# Patient Record
Sex: Female | Born: 1955 | Race: White | Hispanic: No | Smoking: Current every day smoker
Health system: Southern US, Community
[De-identification: ages and names within clinical notes are randomized; demographics above are authoritative.]

## PROBLEM LIST (undated history)

## (undated) DIAGNOSIS — Z720 Tobacco use: Secondary | ICD-10-CM

## (undated) DIAGNOSIS — J441 Chronic obstructive pulmonary disease with (acute) exacerbation: Secondary | ICD-10-CM

## (undated) DIAGNOSIS — J45901 Unspecified asthma with (acute) exacerbation: Secondary | ICD-10-CM

## (undated) DIAGNOSIS — Z8619 Personal history of other infectious and parasitic diseases: Secondary | ICD-10-CM

## (undated) DIAGNOSIS — J189 Pneumonia, unspecified organism: Secondary | ICD-10-CM

## (undated) DIAGNOSIS — E079 Disorder of thyroid, unspecified: Secondary | ICD-10-CM

## (undated) HISTORY — DX: Unspecified asthma with (acute) exacerbation: J45.901

## (undated) HISTORY — DX: Personal history of other infectious and parasitic diseases: Z86.19

## (undated) HISTORY — PX: OTHER SURGICAL HISTORY: SHX169

## (undated) HISTORY — DX: Tobacco use: Z72.0

## (undated) HISTORY — DX: Chronic obstructive pulmonary disease with (acute) exacerbation: J44.1

---

## 1998-01-28 ENCOUNTER — Inpatient Hospital Stay (HOSPITAL_COMMUNITY): Admission: EM | Admit: 1998-01-28 | Discharge: 1998-01-30 | Payer: Self-pay | Admitting: Emergency Medicine

## 1998-01-28 ENCOUNTER — Encounter: Payer: Self-pay | Admitting: Emergency Medicine

## 1998-01-30 ENCOUNTER — Inpatient Hospital Stay (HOSPITAL_COMMUNITY): Admission: EM | Admit: 1998-01-30 | Discharge: 1998-02-01 | Payer: Self-pay | Admitting: *Deleted

## 1998-09-14 ENCOUNTER — Other Ambulatory Visit: Admission: RE | Admit: 1998-09-14 | Discharge: 1998-09-14 | Payer: Self-pay | Admitting: Internal Medicine

## 2004-02-19 ENCOUNTER — Other Ambulatory Visit: Admission: RE | Admit: 2004-02-19 | Discharge: 2004-02-19 | Payer: Self-pay | Admitting: Internal Medicine

## 2004-02-19 ENCOUNTER — Ambulatory Visit: Payer: Self-pay | Admitting: Internal Medicine

## 2004-12-16 ENCOUNTER — Inpatient Hospital Stay (HOSPITAL_COMMUNITY): Admission: EM | Admit: 2004-12-16 | Discharge: 2004-12-19 | Payer: Self-pay | Admitting: Emergency Medicine

## 2004-12-16 ENCOUNTER — Ambulatory Visit: Payer: Self-pay | Admitting: Endocrinology

## 2004-12-23 ENCOUNTER — Ambulatory Visit: Payer: Self-pay | Admitting: Internal Medicine

## 2005-12-25 ENCOUNTER — Ambulatory Visit: Payer: Self-pay | Admitting: Internal Medicine

## 2006-05-25 ENCOUNTER — Ambulatory Visit: Payer: Self-pay | Admitting: Internal Medicine

## 2006-05-25 LAB — CONVERTED CEMR LAB: TSH: 4.98 microintl units/mL (ref 0.35–5.50)

## 2006-11-13 DIAGNOSIS — E039 Hypothyroidism, unspecified: Secondary | ICD-10-CM | POA: Insufficient documentation

## 2006-11-13 DIAGNOSIS — J45909 Unspecified asthma, uncomplicated: Secondary | ICD-10-CM | POA: Insufficient documentation

## 2006-12-17 ENCOUNTER — Ambulatory Visit: Payer: Self-pay | Admitting: Internal Medicine

## 2006-12-17 DIAGNOSIS — T1490XA Injury, unspecified, initial encounter: Secondary | ICD-10-CM

## 2007-02-07 ENCOUNTER — Telehealth: Payer: Self-pay | Admitting: Internal Medicine

## 2007-04-15 ENCOUNTER — Emergency Department (HOSPITAL_COMMUNITY): Admission: EM | Admit: 2007-04-15 | Discharge: 2007-04-15 | Payer: Self-pay | Admitting: Emergency Medicine

## 2007-06-07 ENCOUNTER — Telehealth: Payer: Self-pay | Admitting: Internal Medicine

## 2007-08-18 ENCOUNTER — Emergency Department (HOSPITAL_COMMUNITY): Admission: EM | Admit: 2007-08-18 | Discharge: 2007-08-18 | Payer: Self-pay | Admitting: Emergency Medicine

## 2007-12-04 ENCOUNTER — Telehealth: Payer: Self-pay | Admitting: Internal Medicine

## 2007-12-26 ENCOUNTER — Telehealth (INDEPENDENT_AMBULATORY_CARE_PROVIDER_SITE_OTHER): Payer: Self-pay | Admitting: *Deleted

## 2007-12-27 ENCOUNTER — Ambulatory Visit: Payer: Self-pay | Admitting: Internal Medicine

## 2007-12-27 DIAGNOSIS — F17200 Nicotine dependence, unspecified, uncomplicated: Secondary | ICD-10-CM | POA: Insufficient documentation

## 2008-01-07 ENCOUNTER — Telehealth: Payer: Self-pay | Admitting: Internal Medicine

## 2008-02-15 ENCOUNTER — Emergency Department (HOSPITAL_COMMUNITY): Admission: EM | Admit: 2008-02-15 | Discharge: 2008-02-15 | Payer: Self-pay | Admitting: Emergency Medicine

## 2008-02-15 ENCOUNTER — Encounter: Payer: Self-pay | Admitting: Internal Medicine

## 2008-02-15 ENCOUNTER — Inpatient Hospital Stay (HOSPITAL_COMMUNITY): Admission: EM | Admit: 2008-02-15 | Discharge: 2008-02-17 | Payer: Self-pay | Admitting: Emergency Medicine

## 2008-02-15 ENCOUNTER — Ambulatory Visit: Payer: Self-pay | Admitting: Internal Medicine

## 2008-02-15 DIAGNOSIS — J45901 Unspecified asthma with (acute) exacerbation: Secondary | ICD-10-CM

## 2008-02-15 DIAGNOSIS — J441 Chronic obstructive pulmonary disease with (acute) exacerbation: Secondary | ICD-10-CM

## 2008-02-17 ENCOUNTER — Encounter: Payer: Self-pay | Admitting: Internal Medicine

## 2008-05-04 ENCOUNTER — Ambulatory Visit: Payer: Self-pay | Admitting: Internal Medicine

## 2008-05-04 ENCOUNTER — Inpatient Hospital Stay (HOSPITAL_COMMUNITY): Admission: EM | Admit: 2008-05-04 | Discharge: 2008-05-06 | Payer: Self-pay | Admitting: Emergency Medicine

## 2008-05-05 ENCOUNTER — Encounter: Payer: Self-pay | Admitting: Internal Medicine

## 2008-05-27 ENCOUNTER — Ambulatory Visit: Payer: Self-pay | Admitting: Internal Medicine

## 2008-06-01 ENCOUNTER — Telehealth: Payer: Self-pay | Admitting: Internal Medicine

## 2008-06-02 ENCOUNTER — Ambulatory Visit: Payer: Self-pay | Admitting: *Deleted

## 2008-08-14 ENCOUNTER — Telehealth: Payer: Self-pay | Admitting: *Deleted

## 2008-08-19 ENCOUNTER — Telehealth: Payer: Self-pay | Admitting: Internal Medicine

## 2008-08-21 ENCOUNTER — Telehealth: Payer: Self-pay | Admitting: Internal Medicine

## 2008-10-07 ENCOUNTER — Telehealth: Payer: Self-pay | Admitting: Internal Medicine

## 2009-02-01 ENCOUNTER — Encounter: Payer: Self-pay | Admitting: Family Medicine

## 2009-02-01 ENCOUNTER — Ambulatory Visit: Payer: Self-pay | Admitting: Internal Medicine

## 2009-02-01 LAB — CONVERTED CEMR LAB
ALT: 20 units/L (ref 0–35)
AST: 18 units/L (ref 0–37)
Alkaline Phosphatase: 48 units/L (ref 39–117)
Basophils Absolute: 0.1 10*3/uL (ref 0.0–0.1)
Chlamydia, DNA Probe: NEGATIVE
Cholesterol: 190 mg/dL (ref 0–200)
GC Probe Amp, Genital: NEGATIVE
HCT: 41.4 % (ref 36.0–46.0)
HDL: 43 mg/dL (ref >39)
Hemoglobin: 13.6 g/dL (ref 12.0–15.0)
Monocytes Absolute: 0.7 10*3/uL (ref 0.1–1.0)
Neutro Abs: 5 10*3/uL (ref 1.7–7.7)
RDW: 15.6 % — ABNORMAL HIGH (ref 11.5–15.5)
Total CHOL/HDL Ratio: 4.4
VLDL: 44 mg/dL — ABNORMAL HIGH (ref 0–40)
WBC: 9.7 10*3/uL (ref 4.0–10.5)

## 2009-02-09 ENCOUNTER — Ambulatory Visit (HOSPITAL_COMMUNITY): Admission: RE | Admit: 2009-02-09 | Discharge: 2009-02-09 | Payer: Self-pay | Admitting: Internal Medicine

## 2010-02-01 ENCOUNTER — Emergency Department (HOSPITAL_COMMUNITY): Admission: EM | Admit: 2010-02-01 | Discharge: 2010-02-01 | Payer: Self-pay | Admitting: Emergency Medicine

## 2010-02-18 ENCOUNTER — Emergency Department (HOSPITAL_COMMUNITY)
Admission: EM | Admit: 2010-02-18 | Discharge: 2010-02-18 | Payer: Self-pay | Source: Home / Self Care | Admitting: Emergency Medicine

## 2010-03-09 ENCOUNTER — Telehealth: Payer: Self-pay | Admitting: *Deleted

## 2010-03-25 ENCOUNTER — Emergency Department (HOSPITAL_COMMUNITY)
Admission: EM | Admit: 2010-03-25 | Discharge: 2010-03-25 | Payer: Self-pay | Source: Home / Self Care | Admitting: Emergency Medicine

## 2010-04-21 NOTE — Progress Notes (Signed)
Summary: Pt req work in refill ov todayOut of asthma med-triage  Phone Note Call from Patient Call back at [phone]    Caller: Patient Summary of Call: Pt called and said that she is totally out of her meds for asthma, also almost out of Synthroid. Pt is not with [facility] now and can not get recertified.  Pt is req work in [employer] for Dr. Fabian Sharp today. Pls call asap. Pt says she will end up in ER tonight, if she doesnt get to be seen today.  Initial call taken by: Lucy Antigua,  March 09, 2010 11:45 AM  Follow-up for Phone Call        Wants to know if Dr. Fabian Sharp will take her back until she can get back to [facility] to get prescriptions.  It may be as long as 4 months before she can get re established at [facility]. Follow-up by: Lynann Beaver CMA AAMA,  March 09, 2010 1:08 PM  Additional Follow-up for Phone Call Additional follow up Details #1::        She can reestablish but  see her. need her records sent to [place]  .  I woudnt be able to see her until next week or so.  she may  try urgent care or  if another doc here is available  ( havent seen the message until today .) Additional Follow-up by: Madelin Headings MD,  March 10, 2010 12:47 PM    Additional Follow-up for Phone Call Additional follow up Details #2::    LMTOCB Follow-up by: Romualdo Bolk, CMA Duncan Dull),  March 10, 2010 2:29 PM  Additional Follow-up for Phone Call Additional follow up Details #3:: Details for Additional Follow-up Action Taken: Pt states that she is trying to get [facility] but has been going to ED in the meantime. She can't pay for urgent care up front. Additional Follow-up by: Romualdo Bolk, CMA Duncan Dull),  March 10, 2010 2:39 PM

## 2010-05-19 ENCOUNTER — Emergency Department (HOSPITAL_COMMUNITY)
Admission: EM | Admit: 2010-05-19 | Discharge: 2010-05-19 | Disposition: A | Discharge status: Home / Self Care | Payer: Self-pay | Attending: Emergency Medicine | Admitting: Emergency Medicine

## 2010-05-19 ENCOUNTER — Emergency Department (HOSPITAL_COMMUNITY): Payer: Self-pay

## 2010-05-19 DIAGNOSIS — J3489 Other specified disorders of nose and nasal sinuses: Secondary | ICD-10-CM | POA: Insufficient documentation

## 2010-05-19 DIAGNOSIS — G8929 Other chronic pain: Secondary | ICD-10-CM | POA: Insufficient documentation

## 2010-05-19 DIAGNOSIS — Z79899 Other long term (current) drug therapy: Secondary | ICD-10-CM | POA: Insufficient documentation

## 2010-05-19 DIAGNOSIS — M549 Dorsalgia, unspecified: Secondary | ICD-10-CM | POA: Insufficient documentation

## 2010-05-19 DIAGNOSIS — R0982 Postnasal drip: Secondary | ICD-10-CM | POA: Insufficient documentation

## 2010-05-19 DIAGNOSIS — J45909 Unspecified asthma, uncomplicated: Secondary | ICD-10-CM | POA: Insufficient documentation

## 2010-05-19 DIAGNOSIS — R070 Pain in throat: Secondary | ICD-10-CM | POA: Insufficient documentation

## 2010-05-19 DIAGNOSIS — E039 Hypothyroidism, unspecified: Secondary | ICD-10-CM | POA: Insufficient documentation

## 2010-05-19 DIAGNOSIS — J069 Acute upper respiratory infection, unspecified: Secondary | ICD-10-CM | POA: Insufficient documentation

## 2010-05-19 DIAGNOSIS — R05 Cough: Secondary | ICD-10-CM | POA: Insufficient documentation

## 2010-05-19 DIAGNOSIS — R059 Cough, unspecified: Secondary | ICD-10-CM | POA: Insufficient documentation

## 2010-07-05 LAB — CARDIAC PANEL(CRET KIN+CKTOT+MB+TROPI)
CK, MB: 3.2 ng/mL (ref 0.3–4.0)
Relative Index: INVALID (ref 0.0–2.5)
Total CK: 72 U/L (ref 7–177)

## 2010-07-05 LAB — PROTIME-INR: INR: 1 (ref 0.00–1.49)

## 2010-07-05 LAB — CBC
HCT: 37.3 % (ref 36.0–46.0)
Hemoglobin: 13.2 g/dL (ref 12.0–15.0)
MCHC: 34.9 g/dL (ref 30.0–36.0)
MCHC: 34.5 g/dL (ref 30.0–36.0)
MCV: 104.5 fL — ABNORMAL HIGH (ref 78.0–100.0)
MCV: 104.7 fL — ABNORMAL HIGH (ref 78.0–100.0)
Platelets: 153 10*3/uL (ref 150–400)
RBC: 3.61 MIL/uL — ABNORMAL LOW (ref 3.87–5.11)
RDW: 17.4 % — ABNORMAL HIGH (ref 11.5–15.5)
WBC: 15.6 10*3/uL — ABNORMAL HIGH (ref 4.0–10.5)
WBC: 12.6 10*3/uL — ABNORMAL HIGH (ref 4.0–10.5)

## 2010-07-05 LAB — TROPONIN I: Troponin I: <0.01 ng/mL (ref 0.00–0.06)

## 2010-07-05 LAB — DIFFERENTIAL
Basophils Absolute: 0 10*3/uL (ref 0.0–0.1)
Lymphocytes Relative: 10 % — ABNORMAL LOW (ref 12–46)
Neutrophils Relative %: 88 % — ABNORMAL HIGH (ref 43–77)

## 2010-07-05 LAB — COMPREHENSIVE METABOLIC PANEL
AST: 15 U/L (ref 0–37)
CO2: 25 mEq/L (ref 19–32)
Calcium: 8.8 mg/dL (ref 8.4–10.5)
Chloride: 107 mEq/L (ref 96–112)
GFR calc non Af Amer: >60 mL/min (ref >60)
Glucose, Bld: 144 mg/dL — ABNORMAL HIGH (ref 70–99)
Sodium: 142 mEq/L (ref 135–145)
Total Protein: 6.6 g/dL (ref 6.0–8.3)

## 2010-07-05 LAB — POCT I-STAT 3, ART BLOOD GAS (G3+)
Acid-base deficit: 1 mmol/L (ref 0.0–2.0)
O2 Saturation: 92 %
pCO2 arterial: 34.8 mmHg — ABNORMAL LOW (ref 35.0–45.0)
pH, Arterial: 7.429 — ABNORMAL HIGH (ref 7.350–7.400)

## 2010-07-05 LAB — CK TOTAL AND CKMB (NOT AT ARMC): Total CK: 76 U/L (ref 7–177)

## 2010-07-05 LAB — T3: T3, Total: 73 ng/dl — ABNORMAL LOW (ref 80.0–204.0)

## 2010-08-02 NOTE — H&P (Signed)
NAME:  Phyllis Price, Phyllis Price                    ACCOUNT NO.:  [id number]   MEDICAL RECORD NO.:  [id number]          PATIENT TYPE:  INP   LOCATION:  3737                         FACILITY:  [facility]   PHYSICIAN:  Michiel Cowboy, MDDATE OF BIRTH:  1955-08-01   DATE OF ADMISSION:  05/03/2008  DATE OF DISCHARGE:                              HISTORY & PHYSICAL   CHIEF COMPLAINT:  Shortness of breath and wheezing.   HISTORY OF THE PRESENT ILLNESS:  The patient is a 55 year old female  with a history of asthma, tobacco abuse and hypothyroidism.  The patient  reports that for the past few weeks she has not been feeling well.  She  has been dealing with a cold with runny nose and occasional cough that  she got from her son.  For the past few days though she has noticed  worsening wheezing, which eventually her brought into the emergency  department.  She has been using albuterol at home; however, had stopped  using her Advair about a month ago because she could not afford it.   PAST MEDICAL HISTORY:  The past medical history significant for:  1. Asthma.  2. History of hypothyroidism.  3. Tobacco abuse.   REVIEW OF SYSTEMS:  The review of systems is negative, except for that  noted in the HPI.  She endorses some chest tightness, which she  attributes to her asthma.   SOCIAL HISTORY:  The patient continues to smoke about a pack a day.  Does not use drugs.  She reports drinking alcohol about once a month or  so.   FAMILY HISTORY:  The family history is noncontributory.   ALLERGIES:  No known drug allergies.   MEDICATIONS:  1. Albuterol as needed.  2. Synthroid 75 mcg by mouth daily.  3. The patient stopped her Advair secondary to financial difficulties.   PHYSICAL EXAMINATION:  VITAL SIGNS:  Temperature 98.7, blood pressure  114/77, pulse 107, respirations 24, and she was satting at 88% on room  air and then went up to 99% on 2 liters.  GENERAL APPEARANCE:  The patient appears to be in no  acute distress.  HEENT:  Head is atraumatic.  Somewhat dry mucous membranes.  LUNGS:  The lungs have diffuse wheezes bilaterally.  No crackles.  HEART: The heart has a regular rate and rhythm, but is rapid.  No  murmurs appreciated secondary to severe wheezing.  ABDOMEN:  The abdomen is soft, nontender and nondistended.  EXTREMITIES:  The lower extremities are without clubbing, cyanosis or  edema.  NEUROLOGIC EXAMINATION:  Neurologically she appears to be intact.   LABORATORY DATA:  Labs; none obtained.  Chest x-ray significant for no  infiltrates.  There is hyperinflation noted that could be consistent  with either asthma or COPD.   ASSESSMENT AND PLAN:  1. This is a 55 year old female with a history of asthma now with      asthma exacerbation probably triggered by a recent cold, but we      cannot rule out chronic obstructive pulmonary disease.  We will  admit her for asthma exacerbation.  We will give her Solu-Medrol.      The patient states that she is improving with the nebulizers.  We      will continue with Xopenex given her tachycardia and Atrovent.  If      chronic obstructive pulmonary disease cannot be ruled out we will      also add Augmentin for possible chronic obstructive pulmonary      disease  exacerbation.  I would recommend pulmonary consult in the      morning if there is no significant improvement.  For her baseline      we will obtain arterial blood gases, although the patient is      currently not in respiratory distress and appears to be stable and      comfortable.  2. History of hypothyroidism.  Per review of her records the patient's      thyroid stimulating hormone was down during her last admission.  We      will repeat that and check free T4 and total T3.  For now we will      decrease her dose of Synthroid to 50 and follow the results.  3. Tachycardia.  This is likely related to recent albuterol treatment      as well as shortness of breath, but we  will follow her on      telemetry.  Also given chest tightness this is most likely      noncardiac, but related to her asthma; however, given risk factors      of tobacco abuse and age above 21 we will cycle cardiac enzymes.      We will also obtain a D-dimer although, again __________  would be      less likely in this sensation.  4. Prophylaxes.  Protonix plus Lovenox.   Dr. Rene Paci or her partner will resume the patient's care in  the morning.      Michiel Cowboy, MD  Electronically Signed     AVD/MEDQ  D:  05/04/2008  T:  05/04/2008  Job:  [id number]   cc:   Neta Mends. Fabian Sharp, MD

## 2010-08-02 NOTE — Discharge Summary (Signed)
NAME:  Phyllis Price, Phyllis Price                    ACCOUNT NO.:  [id number]   MEDICAL RECORD NO.:  [id number]          PATIENT TYPE:  INP   LOCATION:  3027                         FACILITY:  [facility]   PHYSICIAN:  Valerie A. Felicity Coyer, MDDATE OF BIRTH:  [birth year]   DATE OF ADMISSION:  05/03/2008  DATE OF DISCHARGE:  05/06/2008                               DISCHARGE SUMMARY   DISCHARGE DIAGNOSES:  1. Acute exacerbation of asthma with underlying bronchitis, improved.      Continue antibiotics with steroid taper and nebs.  2. 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  Cessation      discussed and encouraged.  3. Anxiety disorder.  4. Hypothyroidism.   DISCHARGE MEDICATIONS:  1. Augmentin 875 p.o. b.i.d. x5 additional days to complete 7-day      course.  2. Prednisone 20 mg tablets 3 tablets p.o. daily x3 days, and 2      tablets p.o. daily x3 days, and 1 tablet p.o. daily x3 days, and      1/2 tablet daily x2 days, and stop.  3. DuoNeb 1 treatment q.i.d. x5 days, then q.4 h. p.r.n. shortness of      breath.  4. Ativan 0.5 mg 1 p.o. q.6 h. p.r.n. anxiety, dispense 30 with no      refills.  5. Ventolin inhaler 2 puffs q.4 h. p.r.n. shortness of breath when not      using nebulizer.  6. Synthroid 75 mcg daily.  7. Advair 500/50 b.i.d.   DISPOSITION:  The patient is discharged home in medically improved  condition.  She is hemodynamically stable and her O2 sats were 96% on  room air with exertion on the day of discharge.   Hospital followup is scheduled with her physician, Dr. Alfonse Ras at  [facility], May 27, 2008, at 2:30 p.m.  The patient previously  followed by Dr. Berniece Andreas and is encouraged that she may call as  needed to schedule appointment as she is able.   HOSPITAL COURSE:  1. Acute asthma exacerbation.  The patient is a 55 year old woman with      history of asthma and ongoing tobacco abuse, who came to the      emergency room with increased shortness of breath and  wheezing.      After continuous nebulized treatments and IV steroids in the      emergency room, she was unable to breathe easily, so she was      referred for admission for further evaluation and treatment.  She      was continued on Augmentin for bronchitis like symptoms in the      setting of cold symptoms prior to admission without acute      infiltrate on chest x-ray and nontoxic on exam.  She was treated      with IV Solu-Medrol and q.4-h. nebulizers.  A primary issue was      that of anxiety complicating her breathing, but after 24 hours of      intense medical treatment in this regard, the patient's symptoms  were markedly improved and she was anxious for discharge home.      Followup chest x-ray questioned early patchy infiltrate airspace      disease, but no definitive pneumonia.  She remained afebrile and      nontoxic and was tolerating Augmentin well.  She was transitioned      to oral prednisone, and again cessation of tobacco and other      smoking products was reviewed and encouraged, which the patient      reports she is committed to stopping.  On the third day of      hospitalization, lung exam was much improved and it was reviewed      with her need for continued antibiotic therapy, steroid taper as      described, home nebulizers with resumption of Advair and p.r.n.      albuterol as prior to admission.  She was monitored O2 needs at      home and was 96% on room air after exertion and thus not felt to      require oxygen at this time.  Again, the patient reports she has      done smoking and has followup scheduled at [facility] as listed      above.  She has previously followed with Dr. Fabian Sharp, and is      reminded that if she wishes to follow there, she may call to work      arrangements on this as she is able.  2. Anxiety.  This has been exacerbated by the end of a 3-1/2-year      divorce process, which is now coming to an end.  She was treated      with  p.r.n. Ativan during this hospitalization with good results      and a small prescription for this has been provided as she reports      she has been unable to tolerate other medications for anxiety due      to cough.  Further followup on this issue with her [facility]      physician or Dr. Fabian Sharp on an ongoing outpatient basis.      Valerie A. Felicity Coyer, MD  Electronically Signed     VAL/MEDQ  D:  05/06/2008  T:  05/06/2008  Job:  [id number]

## 2010-08-02 NOTE — Discharge Summary (Signed)
NAME:  Phyllis Price, Phyllis Price                    ACCOUNT NO.:  [id number]   MEDICAL RECORD NO.:  [id number]          PATIENT TYPE:  INP   LOCATION:  5128                         FACILITY:  [facility]   PHYSICIAN:  Valerie A. Felicity Coyer, MDDATE OF BIRTH:  1955/11/25   DATE OF ADMISSION:  02/15/2008  DATE OF DISCHARGE:  02/17/2008                               DISCHARGE SUMMARY   DISCHARGE DIAGNOSES:  1. Acute chronic obstructive pulmonary disease exacerbation in setting      of ongoing tobacco abuse.  2. History of hypothyroidism with suppressed TSH.  Synthroid decreased      this admission, will need outpatient followup TFTs in 4-6 weeks.  3. History of degenerative joint disease.  4. History of syncope.  5. History of mood disorder.  6. History of insomnia.  7. Obesity.  8. History of sinusitis.  9. History of eczema.   HISTORY OF PRESENT ILLNESS:  Ms. Phyllis Price is a 55 year old female who was  admitted on February 15, 2008, with chief complaint of cough and  wheezing which have been present for 3 days.  She denied any fever or  sick contacts.  Her cough has been productive of white sputum.  She  notes that she has smoked off and on for the last several years and is  currently smoking half pack per day.  She apparently was completing an  outpatient prednisone taper at the time of admission.  She was admitted  for further evaluation and treatment.  Of note, she has not been able to  afford her Advair for the last 6 months and her compliance with her  asthma plan has been poor.   COURSE OF HOSPITALIZATION:  1. Acute COPD exacerbation.  The patient was admitted, underwent chest      x-ray which showed stable hyperinflation and central peribronchial      thickening.  She was afebrile with a normal white count.  She was      started on IV Solu-Medrol as well as a nicotine patch and IV      Rocephin as well as IV Zithromax.  She was yesterday changed to      oral prednisone.  Today, she continues to improve  and is anxious      for discharge.  We will plan for discharge to home if her room air      sat is stable with ambulation on a very slow prednisone taper as      well as a 7-day course of oral doxycycline.  2. Hypothyroidism.  The patient was noted to have a depressed TSH      level on her 100 mcg of Synthroid.  TSH this admission was 0.129.      We will decrease her Synthroid to 75 mcg and provide prescription      at the time of discharge; however, she will need to follow up in 4-      6 weeks for TFTs with her primary MD.   MEDICATIONS AT THE TIME OF DISCHARGE:  1. QVAR 80 mcg 2 puffs twice daily in place  of Advair.  2. Proventil MDI 2 puffs every 4 hours as needed.  3. Prednisone 10 mg tablets 4 tablets daily for 3 days, 3 tablets      daily for 3 days, 2 tablets daily for 3 days, and 1 tablet daily      for 3 days.  4. Doxycycline 100 mg p.o. b.i.d. x7 days.  5. Albuterol nebs every 6 hours for the next 1 week, then as needed.  6. Synthroid 75 mcg p.o. daily.   DISPOSITION:  She will be discharged to home.   FOLLOWUP:  She is to follow up with Dr. Berniece Andreas on Friday, February 21, 2008, at 12:15 p.m.  She is instructed to call Dr. Fabian Sharp should she  develop fever over 101, worsening wheezing, or shortness of breath.   Greater than 30 minutes spent on discharge planning.      Sandford Craze, NP      Raenette Rover. Felicity Coyer, MD  Electronically Signed    MO/MEDQ  D:  02/17/2008  T:  02/18/2008  Job:  [id number]   cc:   Neta Mends. Fabian Sharp, MD
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
## 2010-08-05 NOTE — Procedures (Signed)
EEG NUMBER:  [id number].   She is referred for this EEG by her hospitalist, Dr. Rene Paci. The  history given is that of multiple syncopal episodes, question of possible  seizure disorder. This is a routine 16-channel EEG recording with one  channel representing heart rate and rhythm. The patient was exposed to  hyperventilation but not to photic stimulation. The EEG was recorded on  December 16, 2004.   MEDICATIONS:  None listed.   DESCRIPTION:  A posterior dominant background rhythm was not identifiable  for the first half of this recording as the patient is asleep and not awake  and oriented. Once the patient is aroused, her EEG has a posterior dominant  rhythm of 10 Hz that emits synchronously and symmetrically from both  posterior hemispheres, again promptly attenuating with eye opening. Sleep  architecture was synchronous and symmetric throughout. There were vertex  sharp waves as well as sleep spindle formations noticed. No epileptiform  discharges during sleep. The EKG remained in normal sinus rhythm. Once the  patient was aroused and able to participate with her hyperventilation  maneuver, she showed an amplitude buildup and intermittent generalized  slowing which is a normal expected response. There were again no  epileptiform discharges or sharp waves seen emitting from the posterior  hemispheres or central areas of the brain, and the patient fell soon after  her hyperventilation maneuver was completed again back in to sleep.   CONCLUSION:  This is a normal EEG for the patient's age and conscious state.  No epileptiform discharges seen.           ______________________________  Melvyn Novas, M.D.     NW:GNFA  D:  12/17/2004 09:39:07  T:  12/17/2004 10:30:36  Job #:  [id number]

## 2010-09-30 ENCOUNTER — Emergency Department (HOSPITAL_COMMUNITY)
Admission: EM | Admit: 2010-09-30 | Discharge: 2010-09-30 | Disposition: A | Discharge status: Home / Self Care | Payer: Self-pay | Attending: Emergency Medicine | Admitting: Emergency Medicine

## 2010-09-30 DIAGNOSIS — G8929 Other chronic pain: Secondary | ICD-10-CM | POA: Insufficient documentation

## 2010-09-30 DIAGNOSIS — M549 Dorsalgia, unspecified: Secondary | ICD-10-CM | POA: Insufficient documentation

## 2010-09-30 DIAGNOSIS — E039 Hypothyroidism, unspecified: Secondary | ICD-10-CM | POA: Insufficient documentation

## 2010-09-30 DIAGNOSIS — J45909 Unspecified asthma, uncomplicated: Secondary | ICD-10-CM | POA: Insufficient documentation

## 2010-09-30 DIAGNOSIS — R5381 Other malaise: Secondary | ICD-10-CM | POA: Insufficient documentation

## 2010-12-20 ENCOUNTER — Emergency Department (HOSPITAL_COMMUNITY)
Admission: EM | Admit: 2010-12-20 | Discharge: 2010-12-20 | Disposition: A | Discharge status: Home / Self Care | Payer: Self-pay | Attending: Emergency Medicine | Admitting: Emergency Medicine

## 2010-12-20 ENCOUNTER — Emergency Department (HOSPITAL_COMMUNITY): Payer: Self-pay

## 2010-12-20 DIAGNOSIS — Z760 Encounter for issue of repeat prescription: Secondary | ICD-10-CM | POA: Insufficient documentation

## 2010-12-20 DIAGNOSIS — R05 Cough: Secondary | ICD-10-CM | POA: Insufficient documentation

## 2010-12-20 DIAGNOSIS — R059 Cough, unspecified: Secondary | ICD-10-CM | POA: Insufficient documentation

## 2010-12-20 DIAGNOSIS — E039 Hypothyroidism, unspecified: Secondary | ICD-10-CM | POA: Insufficient documentation

## 2010-12-20 DIAGNOSIS — J40 Bronchitis, not specified as acute or chronic: Secondary | ICD-10-CM | POA: Insufficient documentation

## 2010-12-20 DIAGNOSIS — J45909 Unspecified asthma, uncomplicated: Secondary | ICD-10-CM | POA: Insufficient documentation

## 2010-12-20 LAB — CBC
HCT: 39.8 % (ref 36.0–46.0)
Hemoglobin: 13.3 g/dL (ref 12.0–15.0)
MCHC: 33.5 g/dL (ref 30.0–36.0)
Platelets: 184 10*3/uL (ref 150–400)
RDW: 16.3 % — ABNORMAL HIGH (ref 11.5–15.5)

## 2010-12-20 LAB — DIFFERENTIAL
Basophils Absolute: 0 10*3/uL (ref 0.0–0.1)
Basophils Relative: 0 % (ref 0–1)
Eosinophils Relative: 0 % (ref 0–5)
Monocytes Absolute: 0.3 10*3/uL (ref 0.1–1.0)

## 2010-12-20 LAB — TSH: TSH: 0.129 u[IU]/mL — ABNORMAL LOW (ref 0.350–4.500)

## 2010-12-20 LAB — BASIC METABOLIC PANEL
Calcium: 9.4 mg/dL (ref 8.4–10.5)
Chloride: 106 mEq/L (ref 96–112)
Potassium: 4.3 mEq/L (ref 3.5–5.1)
Sodium: 140 mEq/L (ref 135–145)

## 2011-01-27 ENCOUNTER — Emergency Department (INDEPENDENT_AMBULATORY_CARE_PROVIDER_SITE_OTHER)
Admission: EM | Admit: 2011-01-27 | Discharge: 2011-01-27 | Disposition: A | Discharge status: Home / Self Care | Payer: Self-pay | Source: Home / Self Care | Attending: Emergency Medicine | Admitting: Emergency Medicine

## 2011-01-27 ENCOUNTER — Encounter: Payer: Self-pay | Admitting: *Deleted

## 2011-01-27 DIAGNOSIS — E039 Hypothyroidism, unspecified: Secondary | ICD-10-CM

## 2011-01-27 DIAGNOSIS — F17200 Nicotine dependence, unspecified, uncomplicated: Secondary | ICD-10-CM

## 2011-01-27 DIAGNOSIS — J45909 Unspecified asthma, uncomplicated: Secondary | ICD-10-CM

## 2011-01-27 DIAGNOSIS — F17210 Nicotine dependence, cigarettes, uncomplicated: Secondary | ICD-10-CM

## 2011-01-27 HISTORY — DX: Pneumonia, unspecified organism: J18.9

## 2011-01-27 HISTORY — DX: Disorder of thyroid, unspecified: E07.9

## 2011-01-27 MED ORDER — LEVOTHYROXINE SODIUM 100 MCG PO TABS: 100.0000 ug | ORAL_TABLET | Freq: Every day | ORAL | Status: DC

## 2011-01-27 MED ORDER — AZITHROMYCIN 250 MG PO TABS: ORAL_TABLET | ORAL | Status: AC

## 2011-01-27 MED ORDER — PREDNISONE 5 MG PO KIT: 1.0000 | PACK | Freq: Every day | ORAL | Status: DC

## 2011-01-27 MED ORDER — ALBUTEROL SULFATE (2.5 MG/3ML) 0.083% IN NEBU: 2.5000 mg | INHALATION_SOLUTION | RESPIRATORY_TRACT | Status: DC | PRN

## 2011-01-27 NOTE — ED Provider Notes (Signed)
History     CSN: [id number] Arrival date & time: 01/27/2011  6:18 PM   First MD Initiated Contact with Patient 01/27/11 1818      Chief Complaint  Patient presents with  . Asthma    Medication Evaluation    (Consider location/radiation/quality/duration/timing/severity/associated sxs/prior treatment) HPI Comments: Phyllis Price is a 55 year old female who has had asthma since 1999. She usually controls this with albuterol by nebulizer about twice a day. She also has an MDI inhaler as well. She had tried Advair in the past but did not find this particularly effective. Her symptoms are worse in the fall and winter and better in the summer. She was at the emergency room by weeks ago and was diagnosed as having bronchitis. She was given IM steroids and prednisone and she has now run out of her medications, her last treatment being around 10 AM. She's having ongoing symptoms of cough, wheezing, and whitish drainage. She's had some chills but no fever. She denies any nasal congestion, rhinorrhea, or sore throat.  She also has a history of hypothyroidism and is on levothyroxin. She's out of this medication as well and needs a refill. She doesn't have a primary care physician.  She is postmenopausal. She's been hospitalized 7 times in the last 4 years for asthma and she's never been on a ventilator. She still smokes about 5 cigarettes per day.  Patient is a 55 y.o. female presenting with asthma.  Asthma Associated symptoms include shortness of breath. Pertinent negatives include no abdominal pain.    Past Medical History  Diagnosis Date  . Asthma   . Pneumonia   . Thyroid disease     History reviewed. No pertinent past surgical history.  History reviewed. No pertinent family history.  History  Substance Use Topics  . Smoking status: Current Everyday Smoker    Types: Cigarettes  . Smokeless tobacco: Not on file  . Alcohol Use:     OB History    Grav Para Term Preterm Abortions TAB SAB Ect  Mult Living                  Review of Systems  Constitutional: Negative for fever, chills and fatigue.  HENT: Negative for ear pain, congestion, sore throat, rhinorrhea, sneezing, neck stiffness, voice change and postnasal drip.   Eyes: Negative for pain, discharge and redness.  Respiratory: Positive for cough, chest tightness, shortness of breath and wheezing.   Gastrointestinal: Negative for nausea, vomiting, abdominal pain and diarrhea.  Skin: Negative for rash.    Allergies  Review of patient's allergies indicates no known allergies.  Home Medications   Current Outpatient Rx  Name Route Sig Dispense Refill  . ALBUTEROL (5 MG/ML) CONTINUOUS INHALATION SOLN Nebulization Take by nebulization continuous.      Marland Kitchen LEVOTHYROXINE SODIUM PO Oral Take by mouth.      . ALBUTEROL SULFATE (2.5 MG/3ML) 0.083% IN NEBU Nebulization Take 3 mLs (2.5 mg total) by nebulization every 4 (four) hours as needed for wheezing. 120 vial 0  . AZITHROMYCIN 250 MG PO TABS  Take as directed. 6 tablet 0  . LEVOTHYROXINE SODIUM 100 MCG PO TABS Oral Take 1 tablet (100 mcg total) by mouth daily. 30 tablet 0  . PREDNISONE 5 MG PO KIT Oral Take 1 kit (5 mg total) by mouth daily after breakfast. Take as directed. 1 kit 0    BP 121/78  Pulse 75  Temp(Src) 96.8 F (36 C) (Oral)  Resp 18  SpO2 99%  Physical Exam  Nursing note and vitals reviewed. Constitutional: She appears well-developed and well-nourished. No distress.  HENT:  Head: Normocephalic and atraumatic.  Right Ear: External ear normal.  Left Ear: External ear normal.  Nose: Nose normal.  Mouth/Throat: Oropharynx is clear and moist. No oropharyngeal exudate.  Eyes: Conjunctivae and EOM are normal. Pupils are equal, round, and reactive to light. Right eye exhibits no discharge. Left eye exhibits no discharge.  Neck: Normal range of motion. Neck supple.  Cardiovascular: Normal rate, regular rhythm and normal heart sounds.   Pulmonary/Chest: Effort  normal. No stridor. No respiratory distress. She has wheezes (there are diffuse low pitched wheezes bilaterally. She has good air movement.). She has no rales. She exhibits no tenderness.  Lymphadenopathy:    She has no cervical adenopathy.  Skin: Skin is warm and dry. No rash noted. She is not diaphoretic.    ED Course  Procedures (including critical care time)  Labs Reviewed - No data to display No results found.   1. Asthma   2. Hypothyroidism   3. Cigarette smoker       MDM  Her asthma is not very well controlled. She needs to have her primary care physician to follow her. I did give her enough medicine for about a month. I also gave her refill on her of the levothyroxin.  She also get desperately needs to quit smoking and we discussed this. She would like to quit and is thinking about using some over-the-counter methods.        Roque Lias, MD 01/27/11 2223

## 2011-01-27 NOTE — ED Notes (Signed)
C/O running out of albuterol neb solution this AM; c/o some chest tightness & wheezing now.  Also ran out of thyroid med 1 wk ago.  Cannot afford PCP @ this time, and is no longer qualified to continue going to [facility].

## 2011-12-27 ENCOUNTER — Ambulatory Visit (INDEPENDENT_AMBULATORY_CARE_PROVIDER_SITE_OTHER): Payer: Self-pay | Admitting: Internal Medicine

## 2011-12-27 ENCOUNTER — Encounter: Payer: Self-pay | Admitting: Internal Medicine

## 2011-12-27 VITALS — BP 104/74 | HR 110 | Temp 98.4°F | Ht 59.0 in | Wt 139.0 lb

## 2011-12-27 DIAGNOSIS — Z800 Family history of malignant neoplasm of digestive organs: Secondary | ICD-10-CM

## 2011-12-27 DIAGNOSIS — E039 Hypothyroidism, unspecified: Secondary | ICD-10-CM

## 2011-12-27 DIAGNOSIS — Z598 Other problems related to housing and economic circumstances: Secondary | ICD-10-CM

## 2011-12-27 DIAGNOSIS — J45909 Unspecified asthma, uncomplicated: Secondary | ICD-10-CM

## 2011-12-27 DIAGNOSIS — F17200 Nicotine dependence, unspecified, uncomplicated: Secondary | ICD-10-CM

## 2011-12-27 DIAGNOSIS — Z801 Family history of malignant neoplasm of trachea, bronchus and lung: Secondary | ICD-10-CM

## 2011-12-27 MED ORDER — MOMETASONE FURO-FORMOTEROL FUM 100-5 MCG/ACT IN AERO: 2.0000 | INHALATION_SPRAY | Freq: Two times a day (BID) | RESPIRATORY_TRACT | Status: DC

## 2011-12-27 MED ORDER — LEVOTHYROXINE SODIUM 100 MCG PO TABS: 100.0000 ug | ORAL_TABLET | Freq: Every day | ORAL | Status: AC

## 2011-12-27 MED ORDER — PREDNISONE 20 MG PO TABS: ORAL_TABLET | ORAL | Status: DC
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
## 2011-12-27 NOTE — Patient Instructions (Signed)
Become tobacco free for your lungs. Try controller inhaler int he menatiime but if  Not improving in the next 3-5 days can add  Prednisone burst.  Plan labs in 6- 8 weeks or so and then wellness check .  Should get a colonoscopy  Contact us when want [place] to refer.  Get a flu shot when possible

## 2012-01-28 ENCOUNTER — Encounter: Payer: Self-pay | Admitting: Internal Medicine

## 2012-01-28 DIAGNOSIS — Z800 Family history of malignant neoplasm of digestive organs: Secondary | ICD-10-CM | POA: Insufficient documentation

## 2012-01-28 DIAGNOSIS — Z801 Family history of malignant neoplasm of trachea, bronchus and lung: Secondary | ICD-10-CM | POA: Insufficient documentation

## 2012-08-11 ENCOUNTER — Encounter (HOSPITAL_COMMUNITY): Payer: Self-pay | Admitting: Emergency Medicine

## 2012-08-11 ENCOUNTER — Emergency Department (HOSPITAL_COMMUNITY)
Admission: EM | Admit: 2012-08-11 | Discharge: 2012-08-11 | Disposition: A | Discharge status: Home / Self Care | Payer: Self-pay | Attending: Emergency Medicine | Admitting: Emergency Medicine

## 2012-08-11 ENCOUNTER — Other Ambulatory Visit: Payer: Self-pay

## 2012-08-11 ENCOUNTER — Emergency Department (HOSPITAL_COMMUNITY): Payer: Self-pay

## 2012-08-11 DIAGNOSIS — Z8701 Personal history of pneumonia (recurrent): Secondary | ICD-10-CM | POA: Insufficient documentation

## 2012-08-11 DIAGNOSIS — E079 Disorder of thyroid, unspecified: Secondary | ICD-10-CM | POA: Insufficient documentation

## 2012-08-11 DIAGNOSIS — F17200 Nicotine dependence, unspecified, uncomplicated: Secondary | ICD-10-CM | POA: Insufficient documentation

## 2012-08-11 DIAGNOSIS — J441 Chronic obstructive pulmonary disease with (acute) exacerbation: Secondary | ICD-10-CM | POA: Insufficient documentation

## 2012-08-11 DIAGNOSIS — Z720 Tobacco use: Secondary | ICD-10-CM

## 2012-08-11 DIAGNOSIS — Z79899 Other long term (current) drug therapy: Secondary | ICD-10-CM | POA: Insufficient documentation

## 2012-08-11 DIAGNOSIS — R0603 Acute respiratory distress: Secondary | ICD-10-CM

## 2012-08-11 DIAGNOSIS — R509 Fever, unspecified: Secondary | ICD-10-CM | POA: Insufficient documentation

## 2012-08-11 DIAGNOSIS — R059 Cough, unspecified: Secondary | ICD-10-CM | POA: Insufficient documentation

## 2012-08-11 DIAGNOSIS — Z8619 Personal history of other infectious and parasitic diseases: Secondary | ICD-10-CM | POA: Insufficient documentation

## 2012-08-11 DIAGNOSIS — J984 Other disorders of lung: Secondary | ICD-10-CM | POA: Insufficient documentation

## 2012-08-11 DIAGNOSIS — R05 Cough: Secondary | ICD-10-CM | POA: Insufficient documentation

## 2012-08-11 DIAGNOSIS — J440 Chronic obstructive pulmonary disease with acute lower respiratory infection: Secondary | ICD-10-CM

## 2012-08-11 LAB — CBC WITH DIFFERENTIAL/PLATELET
Basophils Absolute: 0 10*3/uL (ref 0.0–0.1)
Basophils Relative: 0 % (ref 0–1)
Eosinophils Absolute: 0.3 10*3/uL (ref 0.0–0.7)
MCH: 34.3 pg — ABNORMAL HIGH (ref 26.0–34.0)
MCHC: 34.5 g/dL (ref 30.0–36.0)
Neutrophils Relative %: 63 % (ref 43–77)
Platelets: 150 10*3/uL (ref 150–400)
RDW: 15.6 % — ABNORMAL HIGH (ref 11.5–15.5)

## 2012-08-11 LAB — COMPREHENSIVE METABOLIC PANEL
ALT: 9 U/L (ref 0–35)
AST: 16 U/L (ref 0–37)
Albumin: 4.3 g/dL (ref 3.5–5.2)
Alkaline Phosphatase: 58 U/L (ref 39–117)
BUN: 8 mg/dL (ref 6–23)
Potassium: 3.8 mEq/L (ref 3.5–5.1)
Sodium: 141 mEq/L (ref 135–145)
Total Protein: 7.6 g/dL (ref 6.0–8.3)

## 2012-08-11 LAB — POCT I-STAT 3, VENOUS BLOOD GAS (G3P V)
O2 Saturation: 32 %
TCO2: 28 mmol/L (ref 0–100)
pCO2, Ven: 46.2 mmHg (ref 45.0–50.0)

## 2012-08-11 MED ORDER — ALBUTEROL SULFATE (5 MG/ML) 0.5% IN NEBU
5.0000 mg | INHALATION_SOLUTION | Freq: Once | RESPIRATORY_TRACT | Status: AC
  Administered 2012-08-11: 5 mg via RESPIRATORY_TRACT
  Filled 2012-08-11: qty 1

## 2012-08-11 MED ORDER — METHYLPREDNISOLONE SODIUM SUCC 125 MG IJ SOLR
125.0000 mg | Freq: Once | INTRAMUSCULAR | Status: AC
  Administered 2012-08-11: 125 mg via INTRAVENOUS
  Filled 2012-08-11: qty 2

## 2012-08-11 MED ORDER — ALBUTEROL SULFATE (5 MG/ML) 0.5% IN NEBU
10.0000 mg | INHALATION_SOLUTION | Freq: Once | RESPIRATORY_TRACT | Status: DC
Start: 2012-08-11 — End: 2012-08-11

## 2012-08-11 MED ORDER — ALBUTEROL (5 MG/ML) CONTINUOUS INHALATION SOLN
10.0000 mg/h | INHALATION_SOLUTION | RESPIRATORY_TRACT | Status: DC
  Administered 2012-08-11: 10 mg/h via RESPIRATORY_TRACT
  Filled 2012-08-11: qty 20

## 2012-08-11 MED ORDER — IPRATROPIUM BROMIDE 0.02 % IN SOLN
0.5000 mg | Freq: Once | RESPIRATORY_TRACT | Status: AC
  Administered 2012-08-11: 0.5 mg via RESPIRATORY_TRACT
  Filled 2012-08-11: qty 2.5

## 2012-08-11 MED ORDER — PREDNISONE 20 MG PO TABS: ORAL_TABLET | ORAL | Status: DC

## 2012-08-11 NOTE — ED Provider Notes (Signed)
History     CSN: [id number]  Arrival date & time 08/11/12  0003   First MD Initiated Contact with Patient 08/11/12 0038      Chief Complaint  Patient presents with  . Asthma    (Consider location/radiation/quality/duration/timing/severity/associated sxs/prior treatment) HPI Patient is a 57 yo F with a history of asthma and 20 pack year, 1ppd smoking hx. Multiple previous hospitalizations for asthma excacerbation and pneumonia. Here with complaints of SOB, wheezing, productive cough and fever. Pt notes some slightly blood tinged sputum.  She has been using her Albuterol nebs at home without adequate relief.   Patient reports mildly diminished po intake. She reports difficulty sleeping due to cough and sob. Sx began 2d ago with nasal congestion and rhinorrhea and have gotten progressively worse.   Endorses chest tightness consistent with multiple previous asthma excacerbations. Denies chest pain.   Initially, patient felt her sx would improve with nebs but, now she is not experiencing any relief despite multiple back to back nebs.   Past Medical History  Diagnosis Date  . Asthma   . Pneumonia   . Thyroid disease   . Chronic obstructive asthma with exacerbation   . Tobacco use   . Hx of varicella     Past Surgical History  Procedure Laterality Date  . Denies      Family History  Problem Relation Age of Onset  . Cancer Father     Lung  . Heart failure Mother     Valve Disease  . Cancer Mother     colon cancer   . Hypertension Mother     History  Substance Use Topics  . Smoking status: Current Every Day Smoker    Types: Cigarettes  . Smokeless tobacco: Not on file  . Alcohol Use: Yes     Comment: ocass use    OB History   Grav Para Term Preterm Abortions TAB SAB Ect Mult Living                  Review of Systems Gen: as per history of present illness, otherwise negative Eyes: no discharge or drainage, no occular pain or visual changes Nose: no epistaxis  or rhinorrhea Mouth: no dental pain, no sore throat Neck: no neck pain Lungs:as per history of present illness, otherwise negative CV: no chest pain, palpitations, dependent edema or orthopnea Abd: no abdominal pain, nausea, vomiting GU: no dysuria or gross hematuria MSK: no myalgias or arthralgias Neuro: no headache, no focal neurologic deficits Skin: no rash Psyche: negative.  Allergies  Review of patient's allergies indicates no known allergies.  Home Medications   Current Outpatient Rx  Name  Route  Sig  Dispense  Refill  . albuterol (PROVENTIL HFA;VENTOLIN HFA) 108 (90 BASE) MCG/ACT inhaler   Inhalation   Inhale 2 puffs into the lungs every 6 (six) hours as needed for wheezing or shortness of breath.         Marland Kitchen albuterol (PROVENTIL, VENTOLIN) (5 MG/ML) 0.5% NEBU   Nebulization   Take by nebulization continuous.           Marland Kitchen levothyroxine (SYNTHROID, LEVOTHROID) 100 MCG tablet   Oral   Take 1 tablet (100 mcg total) by mouth daily.   90 tablet   0   . EXPIRED: albuterol (PROVENTIL) (2.5 MG/3ML) 0.083% nebulizer solution   Nebulization   Take 3 mLs (2.5 mg total) by nebulization every 4 (four) hours as needed for wheezing.   120 vial   0  BP 138/81  Pulse 115  Temp(Src) 97.6 F (36.4 C) (Oral)  Resp 20  SpO2 94%  Physical Exam Gen: well developed and well nourished appearing, ill appearing Head: NCAT Eyes: PERL, EOMI Nose: no epistaixis or rhinorrhea Mouth/throat: mucosa is moist and pink Neck: supple, no stridor Lungs: RR 28/min with very diminished BS and high pitched wheezing bilaterally, + accessory muscle use.  Abd: soft, notender, nondistended Back: no ttp, no cva ttp Skin: no rashese, wnl Ext: normal to inspection, no edema Neuro: CN ii-xii grossly intact, no focal deficits Psyche; normal affect,  calm and cooperative.   ED Course  Procedures (including critical care time)  Results for orders placed during the hospital encounter of  08/11/12 (from the past 24 hour(s))  CBC WITH DIFFERENTIAL     Status: Abnormal   Collection Time    08/11/12  1:05 AM      Result Value Range   WBC 9.8  4.0 - 10.5 K/uL   RBC 4.00  3.87 - 5.11 MIL/uL   Hemoglobin 13.7  12.0 - 15.0 g/dL   HCT 16.1  09.6 - 04.5 %   MCV 99.3  78.0 - 100.0 fL   MCH 34.3 (*) 26.0 - 34.0 pg   MCHC 34.5  30.0 - 36.0 g/dL   RDW 40.9 (*) 81.1 - 91.4 %   Platelets 150  150 - 400 K/uL   Neutrophils Relative % 63  43 - 77 %   Neutro Abs 6.2  1.7 - 7.7 K/uL   Lymphocytes Relative 27  12 - 46 %   Lymphs Abs 2.6  0.7 - 4.0 K/uL   Monocytes Relative 7  3 - 12 %   Monocytes Absolute 0.7  0.1 - 1.0 K/uL   Eosinophils Relative 3  0 - 5 %   Eosinophils Absolute 0.3  0.0 - 0.7 K/uL   Basophils Relative 0  0 - 1 %   Basophils Absolute 0.0  0.0 - 0.1 K/uL  COMPREHENSIVE METABOLIC PANEL     Status: Abnormal   Collection Time    08/11/12  1:05 AM      Result Value Range   Sodium 141  135 - 145 mEq/L   Potassium 3.8  3.5 - 5.1 mEq/L   Chloride 104  96 - 112 mEq/L   CO2 27  19 - 32 mEq/L   Glucose, Bld 124 (*) 70 - 99 mg/dL   BUN 8  6 - 23 mg/dL   Creatinine, Ser 7.82  0.50 - 1.10 mg/dL   Calcium 9.6  8.4 - 95.6 mg/dL   Total Protein 7.6  6.0 - 8.3 g/dL   Albumin 4.3  3.5 - 5.2 g/dL   AST 16  0 - 37 U/L   ALT 9  0 - 35 U/L   Alkaline Phosphatase 58  39 - 117 U/L   Total Bilirubin 0.7  0.3 - 1.2 mg/dL   GFR calc non Af Amer >90  >90 mL/min   GFR calc Af Amer >90  >90 mL/min  POCT I-STAT 3, BLOOD GAS (G3P V)     Status: Abnormal   Collection Time    08/11/12  1:19 AM      Result Value Range   pH, Ven 7.367 (*) 7.250 - 7.300   pCO2, Ven 46.2  45.0 - 50.0 mmHg   pO2, Ven 21.0 (*) 30.0 - 45.0 mmHg   Bicarbonate 26.6 (*) 20.0 - 24.0 mEq/L   TCO2 28  0 - 100  mmol/L   O2 Saturation 32.0     Acid-Base Excess 1.0  0.0 - 2.0 mmol/L   Sample type VENOUS     Comment NOTIFIED PHYSICIAN     EKG: nsr, no acute ischemic changes, normal intervals, normal axis, normal  qrs complex  CXR: normal cardiac silloute, normal appearing mediastinum, no infiltrates, no acute process identified.    MDM  DDX: asthma/copd excacerbation, acute bronchitis, pneumonia, pneumothroax, pleural effusion.   Patient has been treated with albuterol 10mg /Atrovent 0.5mg  and another Albuterol 5mg  svn along with prednisone for asthma/copd exacacerbation. Feeling better but, remains persistently tachycardic. Wheezing persists although patient is moving air much better. Sats marginal at 93 to 94% on RA while at rest. CXR negative for acute process. Normal VBG.   Patient says she is feeling much better and is adamant about wish to be discharged. We will check ambulatory 02 sats and plan for discharge. I have counseled the patient regarding absolute importance of smoking cessation, need for steroid burst and outpatient f/u. Patient agrees to return to the ED or call 911 promptly for any decompensation or worsening sx.         Brandt Loosen, MD 08/11/12 [phone]

## 2012-08-11 NOTE — ED Notes (Signed)
PT. REPORTS PROGRESSING ASTHMA WITH PRODUCTIVE COUGH / NASAL CONGESTION FOR 4 DAYS UNRELIEVED BY MDI, DENIES FEVER OR CHILLS.

## 2012-08-11 NOTE — ED Notes (Signed)
Ambulated patient in the hall without any shortness of breath, patient did have a coughing spell after she returned to the room

## 2012-08-11 NOTE — ED Notes (Signed)
Xray and Phleb in PT room

## 2012-08-17 LAB — CULTURE, BLOOD (ROUTINE X 2): Culture: NO GROWTH

## 2013-05-19 ENCOUNTER — Emergency Department (INDEPENDENT_AMBULATORY_CARE_PROVIDER_SITE_OTHER): Payer: Self-pay

## 2013-05-19 ENCOUNTER — Encounter (HOSPITAL_COMMUNITY): Payer: Self-pay | Admitting: Emergency Medicine

## 2013-05-19 ENCOUNTER — Emergency Department (INDEPENDENT_AMBULATORY_CARE_PROVIDER_SITE_OTHER)
Admission: EM | Admit: 2013-05-19 | Discharge: 2013-05-19 | Disposition: A | Discharge status: Home / Self Care | Payer: Self-pay | Source: Home / Self Care | Attending: Emergency Medicine | Admitting: Emergency Medicine

## 2013-05-19 ENCOUNTER — Telehealth: Payer: Self-pay | Admitting: Internal Medicine

## 2013-05-19 DIAGNOSIS — J111 Influenza due to unidentified influenza virus with other respiratory manifestations: Secondary | ICD-10-CM

## 2013-05-19 DIAGNOSIS — J209 Acute bronchitis, unspecified: Secondary | ICD-10-CM

## 2013-05-19 DIAGNOSIS — J440 Chronic obstructive pulmonary disease with acute lower respiratory infection: Secondary | ICD-10-CM

## 2013-05-19 DIAGNOSIS — R69 Illness, unspecified: Principal | ICD-10-CM

## 2013-05-19 MED ORDER — GUAIFENESIN-CODEINE 100-10 MG/5ML PO SYRP: 10.0000 mL | ORAL_SOLUTION | Freq: Four times a day (QID) | ORAL | Status: DC | PRN

## 2013-05-19 MED ORDER — IPRATROPIUM-ALBUTEROL 0.5-2.5 (3) MG/3ML IN SOLN
RESPIRATORY_TRACT | Status: AC
  Filled 2013-05-19: qty 6

## 2013-05-19 MED ORDER — DOXYCYCLINE HYCLATE 100 MG PO TABS: 100.0000 mg | ORAL_TABLET | Freq: Two times a day (BID) | ORAL | Status: DC

## 2013-05-19 MED ORDER — PREDNISONE 20 MG PO TABS: 20.0000 mg | ORAL_TABLET | Freq: Two times a day (BID) | ORAL | Status: DC

## 2013-05-19 MED ORDER — METHYLPREDNISOLONE ACETATE 80 MG/ML IJ SUSP
INTRAMUSCULAR | Status: AC
  Filled 2013-05-19: qty 1

## 2013-05-19 MED ORDER — ALBUTEROL SULFATE (2.5 MG/3ML) 0.083% IN NEBU: 2.5000 mg | INHALATION_SOLUTION | Freq: Four times a day (QID) | RESPIRATORY_TRACT | Status: DC | PRN

## 2013-05-19 MED ORDER — IPRATROPIUM-ALBUTEROL 0.5-2.5 (3) MG/3ML IN SOLN
3.0000 mL | RESPIRATORY_TRACT | Status: DC
  Administered 2013-05-19: 3 mL via RESPIRATORY_TRACT

## 2013-05-19 MED ORDER — ALBUTEROL SULFATE HFA 108 (90 BASE) MCG/ACT IN AERS: 1.0000 | INHALATION_SPRAY | Freq: Four times a day (QID) | RESPIRATORY_TRACT | Status: DC | PRN

## 2013-05-19 MED ORDER — METHYLPREDNISOLONE ACETATE 80 MG/ML IJ SUSP
80.0000 mg | Freq: Once | INTRAMUSCULAR | Status: AC
  Administered 2013-05-19: 80 mg via INTRAMUSCULAR

## 2013-05-19 NOTE — ED Notes (Signed)
reports flu like symptoms off/on for the past five weeks.    C/o  Chills.  Fever.  Body aches.  Sob, hx of asthma and COPD

## 2013-05-19 NOTE — Telephone Encounter (Signed)
Noted  

## 2013-05-19 NOTE — Telephone Encounter (Signed)
Patient Information:  Caller Name: Phyllis Price  Phone: [phone]  Patient: Phyllis Price, Phyllis Price  Gender: Female  DOB: 1955/07/20  Age: 58 Years  PCP: Berniece AndreasPanosh, Wanda (Family Practice)  Office Follow Up:  Does the office need to follow up with this patient?: No  Instructions For The Office: N/A  RN Note:  [facility] - says if not available to assign appointment right [facility], does not want to wait any time to get Appointment.  Symptoms  Reason For Call & Symptoms: Started with bad cold 5 weeks ago, improved some after 2 weeks but symptoms becoming more severe one week later.  Kept thinking it would get better but symptoms not improving at all.  Has lot of congestion, lot of cough.  Coughing up very thick green mucus with streak fo blood in mornings, sputum cleared in the evenings  Face hurts, eyes sensitive to light and sound.  At one time not eating for a week then finally had BM that was runny.  Has lost 30 Lbs in the last 5 weeks,  Shaking chills sometimes as long as 30 minutes and fever (per touch) daily.  Really feeling terrible.  Reviewed Health History In EMR: Yes  Reviewed Medications In EMR: Yes  Reviewed Allergies In EMR: Yes  Reviewed Surgeries / Procedures: Yes  Date of Onset of Symptoms: Unknown  Treatments Tried: Mucinex  Treatments Tried Worked: No  Guideline(s) Used:  Cough  Disposition Per Guideline:   Go to Office Now  Reason For Disposition Reached:   Coughed up > 1 tablespoon (15 ml) blood (Exception: blood-tinged sputum)  Advice Given:  N/A  Patient Refused Recommendation:  [facility] Will [facility] To [facility]  [facility], [facility].  S[facility] and hung up.

## 2013-05-19 NOTE — Discharge Instructions (Signed)
Acute Bronchitis Bronchitis is inflammation of the airways that extend from the windpipe into the lungs (bronchi). The inflammation often causes mucus to develop. This leads to a cough, which is the most common symptom of bronchitis.  In acute bronchitis, the condition usually develops suddenly and goes away over time, usually in a couple weeks. Smoking, allergies, and asthma can make bronchitis worse. Repeated episodes of bronchitis may cause further lung problems.  CAUSES Acute bronchitis is most often caused by the same virus that causes a cold. The virus can spread from person to person (contagious).  SIGNS AND SYMPTOMS   Cough.   Fever.   Coughing up mucus.   Body aches.   Chest congestion.   Chills.   Shortness of breath.   Sore throat.  DIAGNOSIS  Acute bronchitis is usually diagnosed through a physical exam. Tests, such as chest X-rays, are sometimes done to rule out other conditions.  TREATMENT  Acute bronchitis usually goes away in a couple weeks. Often times, no medical treatment is necessary. Medicines are sometimes given for relief of fever or cough. Antibiotics are usually not needed but may be prescribed in certain situations. In some cases, an inhaler may be recommended to help reduce shortness of breath and control the cough. A cool mist vaporizer may also be used to help thin bronchial secretions and make it easier to clear the chest.  HOME CARE INSTRUCTIONS  Get plenty of rest.   Drink enough fluids to keep your urine clear or pale yellow (unless you have a medical condition that requires fluid restriction). Increasing fluids may help thin your secretions and will prevent dehydration.   Only take over-the-counter or prescription medicines as directed by your health care provider.   Avoid smoking and secondhand smoke. Exposure to cigarette smoke or irritating chemicals will make bronchitis worse. If you are a smoker, consider using nicotine gum or skin  patches to help control withdrawal symptoms. Quitting smoking will help your lungs heal faster.   Reduce the chances of another bout of acute bronchitis by washing your hands frequently, avoiding people with cold symptoms, and trying not to touch your hands to your mouth, nose, or eyes.   Follow up with your health care provider as directed.  SEEK MEDICAL CARE IF: Your symptoms do not improve after 1 week of treatment.  SEEK IMMEDIATE MEDICAL CARE IF:  You develop an increased fever or chills.   You have chest pain.   You have severe shortness of breath.  You have bloody sputum.   You develop dehydration.  You develop fainting.  You develop repeated vomiting.  You develop a severe headache. MAKE SURE YOU:   Understand these instructions.  Will watch your condition.  Will get help right away if you are not doing well or get worse. Document Released: 04/13/2004 Document Revised: 11/06/2012 Document Reviewed: 08/27/2012 [facility] Patient Information 2014 [place], [place].  Chronic Obstructive Pulmonary Disease Chronic obstructive pulmonary disease (COPD) is a common lung condition in which airflow from the lungs is limited. COPD is a general term that can be used to describe many different lung problems that limit airflow, including both chronic bronchitis and emphysema. If you have COPD, your lung function will probably never return to normal, but there are measures you can take to improve lung function and make yourself feel better.  CAUSES   Smoking (common).   Exposure to secondhand smoke.   Genetic problems.  Chronic inflammatory lung diseases or recurrent infections. SYMPTOMS   Shortness  of breath, especially with physical activity.   Deep, persistent (chronic) cough with a large amount of thick mucus.   Wheezing.   Rapid breaths (tachypnea).   Gray or bluish discoloration (cyanosis) of the skin, especially in fingers, toes, or lips.   Fatigue.    Weight loss.   Frequent infections or episodes when breathing symptoms become much worse (exacerbations).   Chest tightness. DIAGNOSIS  Your healthcare provider will take a medical history and perform a physical examination to make the initial diagnosis. Additional tests for COPD may include:   Lung (pulmonary) function tests.  Chest X-ray.  CT scan.  Blood tests. TREATMENT  Treatment available to help you feel better when you have COPD include:   Inhaler and nebulizer medicines. These help manage the symptoms of COPD and make your breathing more comfortable  Supplemental oxygen. Supplemental oxygen is only helpful if you have a low oxygen level in your blood.   Exercise and physical activity. These are beneficial for nearly all people with COPD. Some people may also benefit from a pulmonary rehabilitation program. HOME CARE INSTRUCTIONS   Take all medicines (inhaled or pills) as directed by your health care provider.  Only take over-the-counter or prescription medicines for pain, fever, or discomfort as directed by your health care provider.   Avoid over-the-counter medicines or cough syrups that dry up your airway (such as antihistamines) and slow down the elimination of secretions unless instructed otherwise by your healthcare provider.   If you are a smoker, the most important thing that you can do is stop smoking. Continuing to smoke will cause further lung damage and breathing trouble. Ask your health care provider for help with quitting smoking. He or she can direct you to community resources or hospitals that provide support.  Avoid exposure to irritants such as smoke, chemicals, and fumes that aggravate your breathing.  Use oxygen therapy and pulmonary rehabilitation if directed by your health care provider. If you require home oxygen therapy, ask your healthcare provider whether you should purchase a pulse oximeter to measure your oxygen level at home.    Avoid contact with individuals who have a contagious illness.  Avoid extreme temperature and humidity changes.  Eat healthy foods. Eating smaller, more frequent meals and resting before meals may help you maintain your strength.  Stay active, but balance activity with periods of rest. Exercise and physical activity will help you maintain your ability to do things you want to do.  Preventing infection and hospitalization is very important when you have COPD. Make sure to receive all the vaccines your health care provider recommends, especially the pneumococcal and influenza vaccines. Ask your healthcare provider whether you need a pneumonia vaccine.  Learn and use relaxation techniques to manage stress.  Learn and use controlled breathing techniques as directed by your health care provider. Controlled breathing techniques include:   Pursed lip breathing. Start by breathing in (inhaling) through your nose for 1 second. Then, purse your lips as if you were going to whistle and breathe out (exhale) through the pursed lips for 2 seconds.   Diaphragmatic breathing. Start by putting one hand on your abdomen just above your waist. Inhale slowly through your nose. The hand on your abdomen should move out. Then purse your lips and exhale slowly. You should be able to feel the hand on your abdomen moving in as you exhale.   Learn and use controlled coughing to clear mucus from your lungs. Controlled coughing is a series of short,  progressive coughs. The steps of controlled coughing are:  1. Lean your head slightly forward.  2. Breathe in deeply using diaphragmatic breathing.  3. Try to hold your breath for 3 seconds.  4. Keep your mouth slightly open while coughing twice.  5. Spit any mucus out into a tissue.  6. Rest and repeat the steps once or twice as needed. SEEK MEDICAL CARE IF:   You are coughing up more mucus than usual.   There is a change in the color or thickness of your  mucus.   Your breathing is more labored than usual.   Your breathing is faster than usual.  SEEK IMMEDIATE MEDICAL CARE IF:   You have shortness of breath while you are resting.   You have shortness of breath that prevents you from:  Being able to talk.   Performing your usual physical activities.   You have chest pain lasting longer than 5 minutes.   Your skin color is more cyanotic than usual.  You measure low oxygen saturations for longer than 5 minutes with a pulse oximeter. MAKE SURE YOU:   Understand these instructions.  Will watch your condition.  Will get help right away if you are not doing well or get worse. Document Released: 12/14/2004 Document Revised: 12/25/2012 Document Reviewed: 10/31/2012 [facility] Patient Information 2014 [place], [place].  Influenza, Adult Influenza ("the flu") is a viral infection of the respiratory tract. It occurs more often in winter months because people spend more time in close contact with one another. Influenza can make you feel very sick. Influenza easily spreads from person to person (contagious). CAUSES  Influenza is caused by a virus that infects the respiratory tract. You can catch the virus by breathing in droplets from an infected person's cough or sneeze. You can also catch the virus by touching something that was recently contaminated with the virus and then touching your mouth, nose, or eyes. SYMPTOMS  Symptoms typically last 4 to 10 days and may include:  Fever.  Chills.  Headache, body aches, and muscle aches.  Sore throat.  Chest discomfort and cough.  Poor appetite.  Weakness or feeling tired.  Dizziness.  Nausea or vomiting. DIAGNOSIS  Diagnosis of influenza is often made based on your history and a physical exam. A nose or throat swab test can be done to confirm the diagnosis. RISKS AND COMPLICATIONS You may be at risk for a more severe case of influenza if you smoke cigarettes, have diabetes,  have chronic heart disease (such as heart failure) or lung disease (such as asthma), or if you have a weakened immune system. Elderly people and pregnant women are also at risk for more serious infections. The most common complication of influenza is a lung infection (pneumonia). Sometimes, this complication can require emergency medical care and may be life-threatening. PREVENTION  An annual influenza vaccination (flu shot) is the best way to avoid getting influenza. An annual flu shot is now routinely recommended for all adults in the [place] TREATMENT  In mild cases, influenza goes away on its own. Treatment is directed at relieving symptoms. For more severe cases, your caregiver may prescribe antiviral medicines to shorten the sickness. Antibiotic medicines are not effective, because the infection is caused by a virus, not by bacteria. HOME CARE INSTRUCTIONS  Only take over-the-counter or prescription medicines for pain, discomfort, or fever as directed by your caregiver.  Use a cool mist humidifier to make breathing easier.  Get plenty of rest until your temperature returns to  normal. This usually takes 3 to 4 days.  Drink enough fluids to keep your urine clear or pale yellow.  Cover your mouth and nose when coughing or sneezing, and wash your hands well to avoid spreading the virus.  Stay home from work or school until your fever has been gone for at least 1 full day. SEEK MEDICAL CARE IF:   You have chest pain or a deep cough that worsens or produces more mucus.  You have nausea, vomiting, or diarrhea. SEEK IMMEDIATE MEDICAL CARE IF:   You have difficulty breathing, shortness of breath, or your skin or nails turn bluish.  You have severe neck pain or stiffness.  You have a severe headache, facial pain, or earache.  You have a worsening or recurring fever.  You have nausea or vomiting that cannot be controlled. MAKE SURE YOU:  Understand these instructions.  Will watch your  condition.  Will get help right away if you are not doing well or get worse. Document Released: 03/03/2000 Document Revised: 09/05/2011 Document Reviewed: 06/05/2011 [facility] Patient Information 2014 [place], [place].

## 2013-05-19 NOTE — ED Provider Notes (Signed)
Chief Complaint   Chief Complaint  Patient presents with  . Influenza    History of Present Illness   Phyllis Price is a 58 year old female cigarette smoker who has had a two-week history of generalized myalgias, headache, light and sound sensitivity, subjective fever, sweats, chills, cough productive yellow sputum with blood, chest tightness, wheezing, nasal congestion with yellow rhinorrhea, headache, sinus pressure, buzzing in the ears, and sore throat. She has a history of asthma and COPD. She sees Dr. Fabian SharpPanosh for this. She does not have a pulmonologist. Her only medication is albuterol both by MDI inhaler and by nebulizer. She occasionally smokes cigarettes.  Review of Systems   Other than as noted above, the patient denies any of the following symptoms: Systemic:  No fevers, chills, sweats, or myalgias. Eye:  No redness or discharge. ENT:  No ear pain, headache, nasal congestion, drainage, sinus pressure, or sore throat. Neck:  No neck pain, stiffness, or swollen glands. Lungs:  No cough, sputum production, hemoptysis, wheezing, chest tightness, shortness of breath or chest pain. GI:  No abdominal pain, nausea, vomiting or diarrhea.  PMFSH   Past medical history, family history, social history, meds, and allergies were reviewed. She has hypothyroidism and takes Synthroid.  Physical exam   Vital signs:  BP 111/72  Pulse 80  Temp(Src) 97.7 F (36.5 C) (Oral)  Resp 18  SpO2 99% General:  Alert and oriented.  In no distress.  Skin warm and dry. Eye:  No conjunctival injection or drainage. Lids were normal. ENT:  TMs and canals were normal, without erythema or inflammation.  Nasal mucosa was clear and uncongested, without drainage.  Mucous membranes were moist.  Pharynx was clear with no exudate or drainage.  There were no oral ulcerations or lesions. Neck:  Supple, no adenopathy, tenderness or mass. Lungs:  No respiratory distress.  She has good air movement bilaterally.  There are scattered expiratory wheezes, no rales or rhonchi.  Heart:  Regular rhythm, without gallops, murmers or rubs. Skin:  Clear, warm, and dry, without rash or lesions.   Radiology   Dg Chest 2 View  05/19/2013   CLINICAL DATA:  Cough.  EXAM: CHEST  2 VIEW  COMPARISON:  Aug 11, 2012.  FINDINGS: The heart size and mediastinal contours are within normal limits. Both lungs are clear. No pneumothorax or pleural effusion is noted. The visualized skeletal structures are unremarkable.  IMPRESSION: No active cardiopulmonary disease.   Electronically Signed   By: Roque LiasJames  Green M.D.   On: 05/19/2013 15:38   [facility]   She was given a DuoNeb breathing treatment with complete clearing her lungs afterwards. She was also given Depo-Medrol 80 mg IM.  Assessment     The primary encounter diagnosis was Influenza-like illness. A diagnosis of COPD with acute bronchitis was also pertinent to this visit.  I think she needs to be followed by pulmonologist as well.  Plan    1.  Meds:  The following meds were prescribed:   New Prescriptions   ALBUTEROL (PROVENTIL HFA;VENTOLIN HFA) 108 (90 BASE) MCG/ACT INHALER    Inhale 1-2 puffs into the lungs every 6 (six) hours as needed for wheezing or shortness of breath.   ALBUTEROL (PROVENTIL) (2.5 MG/3ML) 0.083% NEBULIZER SOLUTION    Take 3 mLs (2.5 mg total) by nebulization every 6 (six) hours as needed for wheezing.   DOXYCYCLINE (VIBRA-TABS) 100 MG TABLET    Take 1 tablet (100 mg total) by mouth 2 (two) times  daily.   GUAIFENESIN-CODEINE (GUIATUSS AC) 100-10 MG/5ML SYRUP    Take 10 mLs by mouth 4 (four) times daily as needed for cough.   PREDNISONE (DELTASONE) 20 MG TABLET    Take 1 tablet (20 mg total) by mouth 2 (two) times daily.    2.  Patient Education/Counseling:  The patient was given appropriate handouts, self care instructions, and instructed in symptomatic relief.  Instructed to get extra fluids, rest, and use a cool mist vaporizer.     3.  Follow up:  The patient was told to follow up here if no better in 3 to 4 days, or sooner if becoming worse in any way, and given some red flag symptoms such as increasing fever, difficulty breathing, chest pain, or persistent vomiting which would prompt immediate return.  Follow up here or with her primary care physician as needed. I also suggested she see Dr. Levy Pupa for pulmonology consult for COPD.      Reuben Likes, MD 05/19/13 [phone]

## 2015-07-09 IMAGING — CR DG CHEST 2V
2 series · 2 of 2 positions shown · non-contrast
Comparison: August 11, 2012.

CLINICAL DATA: Cough.

EXAM:
CHEST  2 VIEW

[view not recorded (1 of 2)]
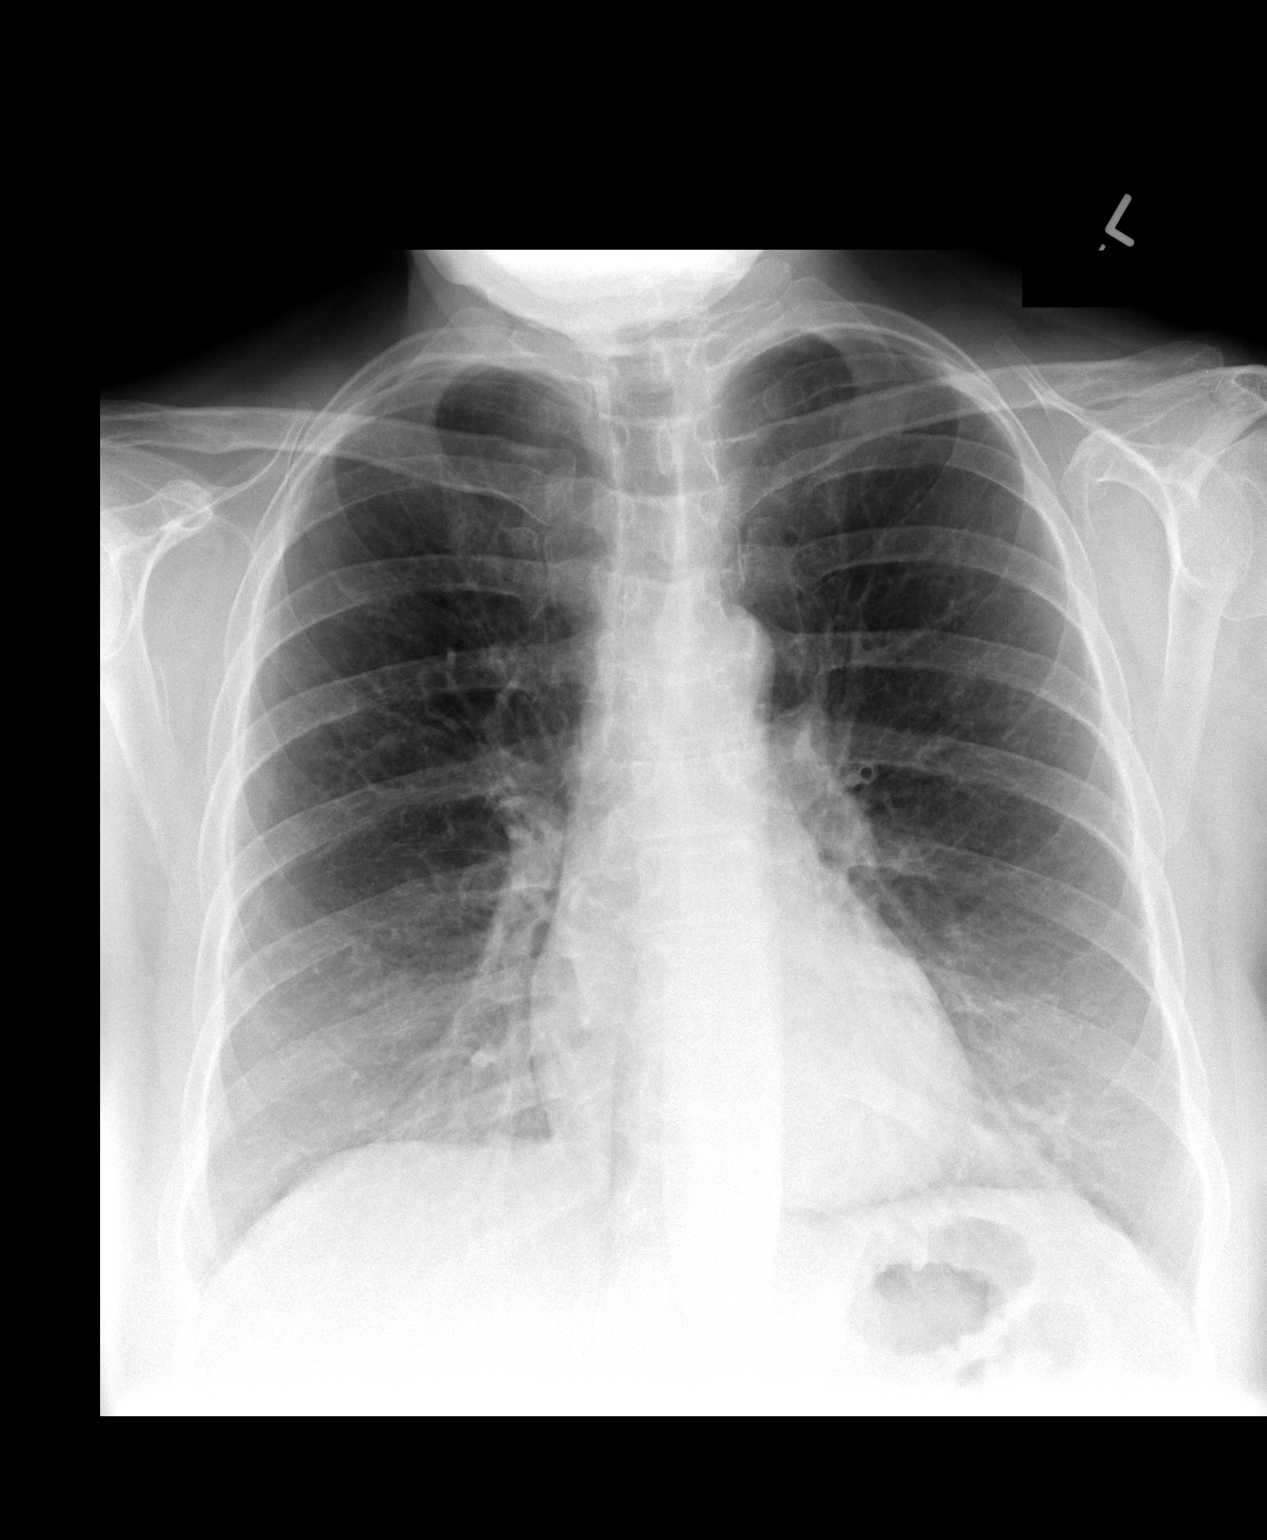

[view not recorded (2 of 2)]
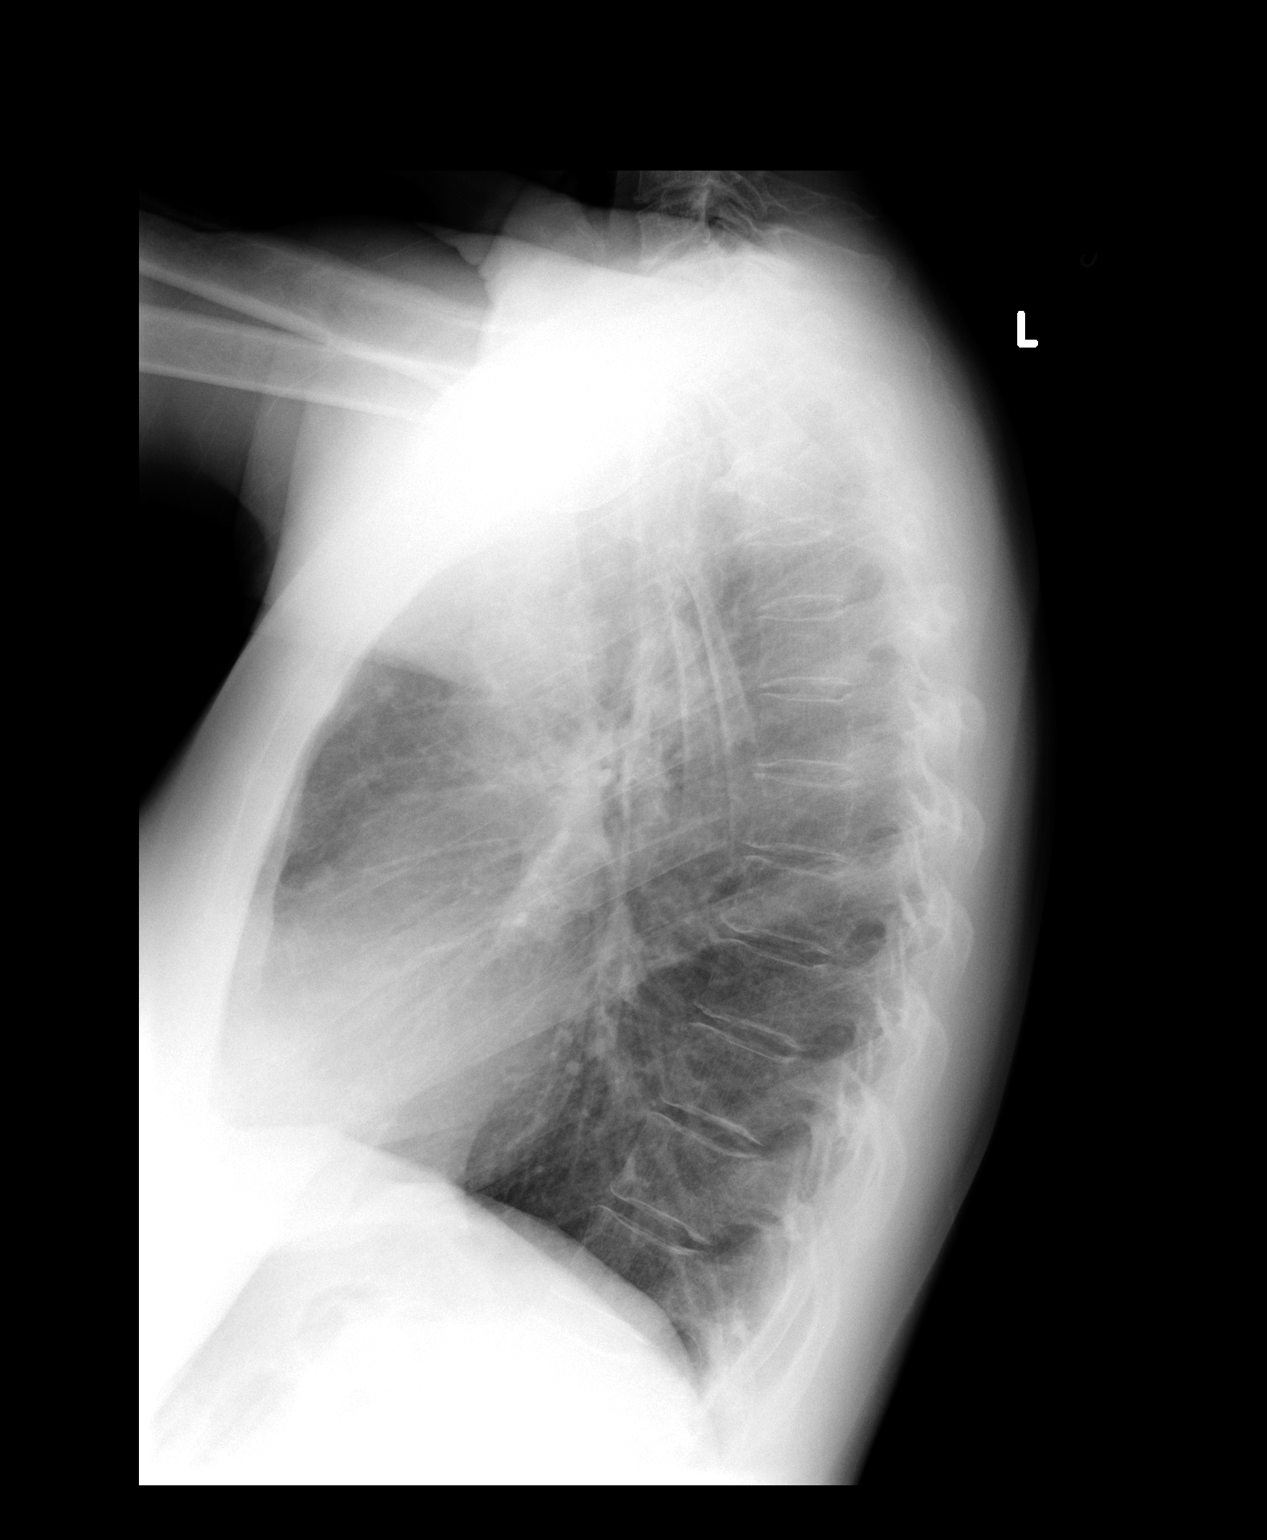

[2 of 2 positions shown; findings below may reference images not displayed]

FINDINGS: The heart size and mediastinal contours are within normal limits.
Both lungs are clear. No pneumothorax or pleural effusion is noted.
The visualized skeletal structures are unremarkable.
IMPRESSION: No active cardiopulmonary disease.

## 2020-10-13 ENCOUNTER — Telehealth: Payer: Self-pay

## 2020-10-13 NOTE — Telephone Encounter (Signed)
Called the patient and the Voice mail is not activated to leave a message

## 2020-10-13 NOTE — Telephone Encounter (Signed)
Cannot take on new patients. Please direct her to providers taking new patients . Wishing her well.

## 2020-10-13 NOTE — Telephone Encounter (Signed)
This patient is wanting to re-establish care with you.   She last seen you on 12/27/2011  Can I schedule her to re-establish care with you?

## 2020-10-20 ENCOUNTER — Telehealth: Payer: Self-pay

## 2020-10-20 NOTE — Telephone Encounter (Signed)
Per previous telephone note Dr. Fabian Sharp is not accepting new patients.

## 2020-10-20 NOTE — Telephone Encounter (Signed)
Patient called wanting to know if Dr. Fabian Sharp will accept her back as a patient call back #[phone]

## 2020-12-06 DIAGNOSIS — J4531 Mild persistent asthma with (acute) exacerbation: Secondary | ICD-10-CM | POA: Diagnosis not present

## 2021-03-03 ENCOUNTER — Ambulatory Visit: Payer: Self-pay | Admitting: Registered Nurse

## 2021-03-05 DIAGNOSIS — R062 Wheezing: Secondary | ICD-10-CM | POA: Diagnosis not present

## 2021-03-29 ENCOUNTER — Encounter (HOSPITAL_COMMUNITY): Payer: Self-pay

## 2021-03-29 ENCOUNTER — Ambulatory Visit (HOSPITAL_COMMUNITY)
Admission: RE | Admit: 2021-03-29 | Discharge: 2021-03-29 | Disposition: A | Discharge status: Home / Self Care | Payer: Medicare HMO | Source: Ambulatory Visit | Attending: Family Medicine | Admitting: Family Medicine

## 2021-03-29 ENCOUNTER — Other Ambulatory Visit: Payer: Self-pay

## 2021-03-29 ENCOUNTER — Ambulatory Visit (INDEPENDENT_AMBULATORY_CARE_PROVIDER_SITE_OTHER): Payer: Medicare HMO

## 2021-03-29 VITALS — BP 147/119 | HR 91 | Temp 97.6°F | Resp 16

## 2021-03-29 DIAGNOSIS — R0602 Shortness of breath: Secondary | ICD-10-CM

## 2021-03-29 DIAGNOSIS — J4521 Mild intermittent asthma with (acute) exacerbation: Secondary | ICD-10-CM | POA: Diagnosis not present

## 2021-03-29 DIAGNOSIS — R051 Acute cough: Secondary | ICD-10-CM

## 2021-03-29 DIAGNOSIS — R059 Cough, unspecified: Secondary | ICD-10-CM | POA: Diagnosis not present

## 2021-03-29 DIAGNOSIS — J441 Chronic obstructive pulmonary disease with (acute) exacerbation: Secondary | ICD-10-CM | POA: Diagnosis not present

## 2021-03-29 MED ORDER — ALBUTEROL SULFATE (2.5 MG/3ML) 0.083% IN NEBU: 2.5000 mg | INHALATION_SOLUTION | RESPIRATORY_TRACT | 0 refills | Status: DC | PRN

## 2021-03-29 MED ORDER — DOXYCYCLINE HYCLATE 100 MG PO CAPS: 100.0000 mg | ORAL_CAPSULE | Freq: Two times a day (BID) | ORAL | 0 refills | Status: DC

## 2021-03-29 MED ORDER — ALBUTEROL SULFATE HFA 108 (90 BASE) MCG/ACT IN AERS: 1.0000 | INHALATION_SPRAY | RESPIRATORY_TRACT | 0 refills | Status: DC | PRN

## 2021-03-29 MED ORDER — ALBUTEROL SULFATE (2.5 MG/3ML) 0.083% IN NEBU
2.5000 mg | INHALATION_SOLUTION | Freq: Once | RESPIRATORY_TRACT | Status: AC
  Administered 2021-03-29: 2.5 mg via RESPIRATORY_TRACT

## 2021-03-29 MED ORDER — PREDNISONE 10 MG (21) PO TBPK: ORAL_TABLET | ORAL | 0 refills | Status: DC

## 2021-03-29 MED ORDER — ALBUTEROL SULFATE (2.5 MG/3ML) 0.083% IN NEBU
INHALATION_SOLUTION | RESPIRATORY_TRACT | Status: AC
  Filled 2021-03-29: qty 3

## 2021-03-29 MED ORDER — METHYLPREDNISOLONE SODIUM SUCC 125 MG IJ SOLR
INTRAMUSCULAR | Status: AC
  Filled 2021-03-29: qty 2

## 2021-03-29 MED ORDER — METHYLPREDNISOLONE SODIUM SUCC 125 MG IJ SOLR
60.0000 mg | Freq: Once | INTRAMUSCULAR | Status: AC
  Administered 2021-03-29: 60 mg via INTRAMUSCULAR

## 2021-03-29 NOTE — ED Triage Notes (Signed)
Pt present to the office for coughing and congestion x 2-3 days. She has h/o asthma and feels like she is having a flare-up.

## 2021-03-29 NOTE — Discharge Instructions (Signed)
Take doxycycline 100 mg twice daily for 10 days to cover for infection.  Stay out of the sun while on this medication as it can give you a bad sunburn.  Take prednisone taper as prescribed.  Do not take NSAIDs including aspirin, ibuprofen/Advil, naproxen/Aleve with this medication as it can cause stomach bleeding.  I have called in refills of your albuterol inhaler and nebulizer solution.  Please use these as previously prescribed every 4-6 hours as needed for shortness of breath symptoms.  I do think it is reasonable to follow-up with your primary care soon as possible to consider maintenance medication such as Symbicort.  If you have any worsening symptoms including chest pain, shortness of breath, worsening cough, fever, nausea/vomiting you need to be seen immediately.

## 2021-03-29 NOTE — ED Provider Notes (Signed)
[facility]    CSN: [id number] Arrival date & time: 03/29/21  [id number]      History   Chief Complaint Chief Complaint  Patient presents with   Cough    HPI Phyllis Price is a 66 y.o. female.   Patient presents today with a several day history of worsening asthma symptoms.  She has a history of asthma that has required hospitalization and is concerned that symptoms are progressing despite regular use of albuterol.  Over the past 2 to 3 days she has had increasing cough, chest tightness, shortness of breath, wheezing.  She denies any known sick contacts.  Denies any recent antibiotic use.  She has not tried any over-the-counter medication for symptom management.  She continues to smoke daily but has significantly decreased the amount of cigarettes she is allowed per day.  She denies any history of diabetes.   Past Medical History:  Diagnosis Date   Asthma    Chronic obstructive asthma with exacerbation (HCC)    Hx of varicella    Pneumonia    Thyroid disease    Tobacco use     Patient Active Problem List   Diagnosis Date Noted   Family hx of lung cancer 01/28/2012   Family history of colon cancer 01/28/2012   CHRONIC OBSTRUCTIVE ASTHMA WITH EXACERBATION 02/15/2008   TOBACCO USE 12/27/2007   HYPOTHYROIDISM 11/13/2006   ASTHMA 11/13/2006    Past Surgical History:  Procedure Laterality Date   Denies      OB History   No obstetric history on file.      Home Medications    Prior to Admission medications   Medication Sig Start Date End Date Taking? Authorizing Provider  albuterol (PROVENTIL HFA;VENTOLIN HFA) 108 (90 BASE) MCG/ACT inhaler Inhale 2 puffs into the lungs every 6 (six) hours as needed for wheezing or shortness of breath.    [provider]  albuterol (PROVENTIL) (2.5 MG/3ML) 0.083% nebulizer solution Take 3 mLs (2.5 mg total) by nebulization every 4 (four) hours as needed for wheezing. 03/29/21 03/29/22  Shandy Checo, Noberto RetortErin K, PA-C  albuterol  (VENTOLIN HFA) 108 (90 Base) MCG/ACT inhaler Inhale 1-2 puffs into the lungs every 4 (four) hours as needed for wheezing or shortness of breath. 03/29/21   Giuliano Preece, Noberto RetortErin K, PA-C  doxycycline (VIBRAMYCIN) 100 MG capsule Take 1 capsule (100 mg total) by mouth 2 (two) times daily. 03/29/21   Yaritzy Huser, Noberto RetortErin K, PA-C  guaiFENesin-codeine (GUIATUSS AC) 100-10 MG/5ML syrup Take 10 mLs by mouth 4 (four) times daily as needed for cough. 05/19/13   Reuben LikesKeller, David C, MD  levothyroxine (SYNTHROID, LEVOTHROID) 100 MCG tablet Take 1 tablet (100 mcg total) by mouth daily. 12/27/11 12/26/12  Panosh, Neta MendsWanda K, MD  predniSONE (STERAPRED UNI-PAK 21 TAB) 10 MG (21) TBPK tablet As directed 03/29/21   Amena Dockham, Noberto RetortErin K, PA-C    Family History Family History  Problem Relation Age of Onset   Cancer Father        Lung   Heart failure Mother        Valve Disease   Cancer Mother        colon cancer    Hypertension Mother     Social History Social History   Tobacco Use   Smoking status: Every Day    Types: Cigarettes  Substance Use Topics   Alcohol use: Yes    Comment: ocass use   Drug use: No     Allergies   Patient has no known allergies.  Review of Systems Review of Systems  Constitutional:  Positive for activity change. Negative for appetite change, fatigue and fever.  HENT:  Positive for congestion and postnasal drip. Negative for sinus pressure, sneezing and sore throat.   Respiratory:  Positive for cough, chest tightness, shortness of breath and wheezing.   Cardiovascular:  Negative for chest pain.  Gastrointestinal:  Negative for abdominal pain, diarrhea, nausea and vomiting.  Musculoskeletal:  Negative for arthralgias and myalgias.  Neurological:  Negative for dizziness, light-headedness and headaches.    Physical Exam Triage Vital Signs ED Triage Vitals  Enc Vitals Group     BP 03/29/21 1011 (!) 147/119     Pulse Rate 03/29/21 1011 91     Resp 03/29/21 1011 16     Temp 03/29/21 1011 97.6 F  (36.4 C)     Temp Source 03/29/21 1011 Oral     SpO2 03/29/21 1011 100 %     Weight --      Height --      Head Circumference --      Peak Flow --      Pain Score 03/29/21 1012 0     Pain Loc --      Pain Edu? --      Excl. in [place]? --    No data found.  Updated Vital Signs BP (!) 147/119 (BP Location: Left Arm)    Pulse 91    Temp 97.6 F (36.4 C) (Oral)    Resp 16    SpO2 100%   Visual Acuity Right Eye Distance:   Left Eye Distance:   Bilateral Distance:    Right Eye Near:   Left Eye Near:    Bilateral Near:     Physical Exam Vitals reviewed.  Constitutional:      General: She is awake. She is not in acute distress.    Appearance: Normal appearance. She is well-developed. She is not ill-appearing.     Comments: Very pleasant female appears stated age no acute distress sitting comfortably in exam room  HENT:     Head: Normocephalic and atraumatic.     Right Ear: Tympanic membrane, ear canal and external ear normal. Tympanic membrane is not erythematous or bulging.     Left Ear: Tympanic membrane, ear canal and external ear normal. Tympanic membrane is not erythematous or bulging.     Nose:     Right Sinus: Maxillary sinus tenderness present. No frontal sinus tenderness.     Left Sinus: Maxillary sinus tenderness present. No frontal sinus tenderness.     Mouth/Throat:     Pharynx: Uvula midline. Posterior oropharyngeal erythema present. No oropharyngeal exudate.     Comments: Erythema and drainage in posterior oropharynx Cardiovascular:     Rate and Rhythm: Normal rate and regular rhythm.     Heart sounds: Normal heart sounds, S1 normal and S2 normal. No murmur heard. Pulmonary:     Effort: Pulmonary effort is normal.     Breath sounds: Wheezing and rhonchi present. No rales.     Comments: Scattered wheezing and rhonchi; improved with albuterol in clinic Psychiatric:        Behavior: Behavior is cooperative.     UC Treatments / Results  Labs (all labs ordered  are listed, but only abnormal results are displayed) Labs Reviewed - No data to display  EKG   Radiology DG Chest 2 View  Result Date: 03/29/2021 CLINICAL DATA:  Cough for 2 weeks EXAM: CHEST - 2 VIEW COMPARISON:  Chest radiograph 05/19/2013 FINDINGS: The cardiomediastinal silhouette is normal. There is no focal consolidation or pulmonary edema. There is no pleural effusion or pneumothorax. There is no acute osseous abnormality. IMPRESSION: No radiographic evidence of acute cardiopulmonary process. Electronically Signed   By: Lesia HausenPeter  Noone M.D.   On: 03/29/2021 10:38    Procedures Procedures (including critical care time)  Medications Ordered in UC Medications  albuterol (PROVENTIL) (2.5 MG/3ML) 0.083% nebulizer solution 2.5 mg (2.5 mg Nebulization Given 03/29/21 1044)  methylPREDNISolone sodium succinate (SOLU-MEDROL) 125 mg/2 mL injection 60 mg (60 mg Intramuscular Given 03/29/21 1104)    Initial Impression / Assessment and Plan / UC Course  I have reviewed the triage vital signs and the nursing notes.  Pertinent labs & imaging results that were available during my care of the patient were reviewed by me and considered in my medical decision making (see chart for details).     Concern for COPD exacerbation given clinical presentation.  Discussed potential utility of viral testing but given patient does not have significant URI symptoms and believes this is COPD exacerbation she declined viral testing today.  Chest x-ray was obtained that showed no acute findings.  Patient was given albuterol nebulizer treatment as well as 60 mg of Solu-Medrol in clinic with significant improvement of symptoms including resolution of chest tightness and shortness of breath.  Refills for albuterol inhaler and nebulizer solution were sent to pharmacy.  Will cover for COPD exacerbation with doxycycline and she was instructed to avoid prolonged sun exposure due to photosensitivity associated with this  medication.  Prednisone taper sent to pharmacy and she was instructed to avoid NSAIDs.  Discussed that if she continues to have to regularly use albuterol she would likely benefit from additional medications and encouraged her to follow-up with her PCP to consider maintenance medication in the future.  Discussed alarm symptoms that warrant emergent evaluation including shortness of breath, chest pain, fever, worsening cough, weakness.  Strict return precautions given to which she expressed understanding.  Final Clinical Impressions(s) / UC Diagnoses   Final diagnoses:  Shortness of breath  Acute cough  COPD exacerbation (HCC)     Discharge Instructions      Take doxycycline 100 mg twice daily for 10 days to cover for infection.  Stay out of the sun while on this medication as it can give you a bad sunburn.  Take prednisone taper as prescribed.  Do not take NSAIDs including aspirin, ibuprofen/Advil, naproxen/Aleve with this medication as it can cause stomach bleeding.  I have called in refills of your albuterol inhaler and nebulizer solution.  Please use these as previously prescribed every 4-6 hours as needed for shortness of breath symptoms.  I do think it is reasonable to follow-up with your primary care soon as possible to consider maintenance medication such as Symbicort.  If you have any worsening symptoms including chest pain, shortness of breath, worsening cough, fever, nausea/vomiting you need to be seen immediately.     ED Prescriptions     Medication Sig Dispense Auth. Provider   albuterol (PROVENTIL) (2.5 MG/3ML) 0.083% nebulizer solution  (Status: Discontinued) Take 3 mLs (2.5 mg total) by nebulization every 4 (four) hours as needed for wheezing. 75 mL Reana Chacko K, PA-C   albuterol (VENTOLIN HFA) 108 (90 Base) MCG/ACT inhaler  (Status: Discontinued) Inhale 1-2 puffs into the lungs every 4 (four) hours as needed for wheezing or shortness of breath. 1 each Raffaella Edison K, PA-C    predniSONE (STERAPRED UNI-PAK  21 TAB) 10 MG (21) TBPK tablet  (Status: Discontinued) As directed 21 tablet Demaurion Dicioccio K, PA-C   doxycycline (VIBRAMYCIN) 100 MG capsule  (Status: Discontinued) Take 1 capsule (100 mg total) by mouth 2 (two) times daily. 20 capsule Audreena Sachdeva K, PA-C   albuterol (PROVENTIL) (2.5 MG/3ML) 0.083% nebulizer solution Take 3 mLs (2.5 mg total) by nebulization every 4 (four) hours as needed for wheezing. 75 mL Mac Dowdell K, PA-C   albuterol (VENTOLIN HFA) 108 (90 Base) MCG/ACT inhaler Inhale 1-2 puffs into the lungs every 4 (four) hours as needed for wheezing or shortness of breath. 1 each Ossiel Marchio K, PA-C   doxycycline (VIBRAMYCIN) 100 MG capsule Take 1 capsule (100 mg total) by mouth 2 (two) times daily. 20 capsule Satomi Buda K, PA-C   predniSONE (STERAPRED UNI-PAK 21 TAB) 10 MG (21) TBPK tablet As directed 21 tablet Maelee Hoot K, PA-C      PDMP not reviewed this encounter.   Jeani Hawking, PA-C 03/29/21 1123

## 2021-04-29 ENCOUNTER — Encounter (HOSPITAL_COMMUNITY): Payer: Self-pay

## 2021-04-29 ENCOUNTER — Other Ambulatory Visit: Payer: Self-pay

## 2021-04-29 ENCOUNTER — Ambulatory Visit (HOSPITAL_COMMUNITY)
Admission: RE | Admit: 2021-04-29 | Discharge: 2021-04-29 | Disposition: A | Discharge status: Home / Self Care | Payer: Medicare HMO | Source: Ambulatory Visit | Attending: Student | Admitting: Student

## 2021-04-29 VITALS — BP 111/73 | HR 80 | Temp 98.2°F | Resp 17

## 2021-04-29 DIAGNOSIS — J0100 Acute maxillary sinusitis, unspecified: Secondary | ICD-10-CM

## 2021-04-29 DIAGNOSIS — J441 Chronic obstructive pulmonary disease with (acute) exacerbation: Secondary | ICD-10-CM | POA: Diagnosis not present

## 2021-04-29 MED ORDER — ALBUTEROL SULFATE (2.5 MG/3ML) 0.083% IN NEBU: 2.5000 mg | INHALATION_SOLUTION | RESPIRATORY_TRACT | 6 refills | Status: DC | PRN

## 2021-04-29 MED ORDER — AMOXICILLIN-POT CLAVULANATE 875-125 MG PO TABS: 1.0000 | ORAL_TABLET | Freq: Two times a day (BID) | ORAL | 0 refills | Status: DC

## 2021-04-29 MED ORDER — ALBUTEROL SULFATE HFA 108 (90 BASE) MCG/ACT IN AERS: 2.0000 | INHALATION_SPRAY | Freq: Four times a day (QID) | RESPIRATORY_TRACT | 0 refills | Status: DC | PRN

## 2021-04-29 NOTE — ED Triage Notes (Signed)
Pt presents with c/o head congestion and chest congestion X 1 month.   States she has felt some what better since her last visit.

## 2021-04-29 NOTE — ED Provider Notes (Signed)
[facility]    CSN: [id number] Arrival date & time: 04/29/21  1231      History   Chief Complaint Chief Complaint  Patient presents with   Head Congestion   Chest Congesiton     HPI Phyllis Price is a 66 y.o. female presenting with head and chest congestion for about 1 month, improving.  We last saw her 03/29/21 at that time treated with doxycycline and albuterol for COPD exacerbation. History COPD, cigarette smoker.  States symptoms are overall improved but she is still having nasal congestion, PND, and occ nonproductive cough. PND is gray sputum. Using the albuterol nebulizer 3x daily and is almost out of this. Denies SOB, CP, dizziness, weakness, fevers/chills.  HPI  Past Medical History:  Diagnosis Date   Asthma    Chronic obstructive asthma with exacerbation ([place])    Hx of varicella    Pneumonia    Thyroid disease    Tobacco use     Patient Active Problem List   Diagnosis Date Noted   Family hx of lung cancer 01/28/2012   Family history of colon cancer 01/28/2012   CHRONIC OBSTRUCTIVE ASTHMA WITH EXACERBATION 02/15/2008   TOBACCO USE 12/27/2007   HYPOTHYROIDISM 11/13/2006   ASTHMA 11/13/2006    Past Surgical History:  Procedure Laterality Date   Denies      OB History   No obstetric history on file.      Home Medications    Prior to Admission medications   Medication Sig Start Date End Date Taking? Authorizing Provider  amoxicillin-clavulanate (AUGMENTIN) 875-125 MG tablet Take 1 tablet by mouth every 12 (twelve) hours. 04/29/21  Yes Hazel Sams, PA-C  albuterol (PROVENTIL) (2.5 MG/3ML) 0.083% nebulizer solution Take 3 mLs (2.5 mg total) by nebulization every 4 (four) hours as needed for wheezing. 04/29/21 04/29/22  Hazel Sams, PA-C  albuterol (VENTOLIN HFA) 108 (90 Base) MCG/ACT inhaler Inhale 1-2 puffs into the lungs every 4 (four) hours as needed for wheezing or shortness of breath. 03/29/21   Raspet, Derry Skill, PA-C  albuterol  (VENTOLIN HFA) 108 (90 Base) MCG/ACT inhaler Inhale 2 puffs into the lungs every 6 (six) hours as needed for wheezing or shortness of breath. 04/29/21   Hazel Sams, PA-C  guaiFENesin-codeine (GUIATUSS AC) 100-10 MG/5ML syrup Take 10 mLs by mouth 4 (four) times daily as needed for cough. 05/19/13   Harden Mo, MD  levothyroxine (SYNTHROID, LEVOTHROID) 100 MCG tablet Take 1 tablet (100 mcg total) by mouth daily. 12/27/11 12/26/12  Panosh, Standley Brooking, MD  predniSONE (STERAPRED UNI-PAK 21 TAB) 10 MG (21) TBPK tablet As directed 03/29/21   Raspet, Derry Skill, PA-C    Family History Family History  Problem Relation Age of Onset   Cancer Father        Lung   Heart failure Mother        Valve Disease   Cancer Mother        colon cancer    Hypertension Mother     Social History Social History   Tobacco Use   Smoking status: Every Day    Types: Cigarettes  Substance Use Topics   Alcohol use: Yes    Comment: ocass use   Drug use: No     Allergies   Codeine   Review of Systems Review of Systems  Constitutional:  Negative for appetite change, chills and fever.  HENT:  Positive for congestion and postnasal drip. Negative for ear pain, rhinorrhea, sinus pressure,  sinus pain and sore throat.   Eyes:  Negative for redness and visual disturbance.  Respiratory:  Positive for cough. Negative for chest tightness, shortness of breath and wheezing.   Cardiovascular:  Negative for chest pain and palpitations.  Gastrointestinal:  Negative for abdominal pain, constipation, diarrhea, nausea and vomiting.  Genitourinary:  Negative for dysuria, frequency and urgency.  Musculoskeletal:  Negative for myalgias.  Neurological:  Negative for dizziness, weakness and headaches.  Psychiatric/Behavioral:  Negative for confusion.   All other systems reviewed and are negative.   Physical Exam Triage Vital Signs ED Triage Vitals  Enc Vitals Group     BP 04/29/21 1307 111/73     Pulse Rate 04/29/21 1307 80      Resp 04/29/21 1307 17     Temp 04/29/21 1307 98.2 F (36.8 C)     Temp Source 04/29/21 1307 Oral     SpO2 04/29/21 1307 94 %     Weight --      Height --      Head Circumference --      Peak Flow --      Pain Score 04/29/21 1308 0     Pain Loc --      Pain Edu? --      Excl. in [place]? --    No data found.  Updated Vital Signs BP 111/73 (BP Location: Right Arm)    Pulse 80    Temp 98.2 F (36.8 C) (Oral)    Resp 17    SpO2 94%   Visual Acuity Right Eye Distance:   Left Eye Distance:   Bilateral Distance:    Right Eye Near:   Left Eye Near:    Bilateral Near:     Physical Exam Vitals reviewed.  Constitutional:      General: She is not in acute distress.    Appearance: Normal appearance. She is not ill-appearing.  HENT:     Head: Normocephalic and atraumatic.     Right Ear: Tympanic membrane, ear canal and external ear normal. No tenderness. No middle ear effusion. There is no impacted cerumen. Tympanic membrane is not perforated, erythematous, retracted or bulging.     Left Ear: Tympanic membrane, ear canal and external ear normal. No tenderness.  No middle ear effusion. There is no impacted cerumen. Tympanic membrane is not perforated, erythematous, retracted or bulging.     Nose: Nose normal. No congestion.     Mouth/Throat:     Mouth: Mucous membranes are moist.     Pharynx: Uvula midline. No oropharyngeal exudate or posterior oropharyngeal erythema.  Eyes:     Extraocular Movements: Extraocular movements intact.     Pupils: Pupils are equal, round, and reactive to light.  Cardiovascular:     Rate and Rhythm: Normal rate and regular rhythm.     Heart sounds: Normal heart sounds.  Pulmonary:     Effort: Pulmonary effort is normal.     Breath sounds: Wheezing present. No decreased breath sounds, rhonchi or rales.     Comments: Few expiratory wheezes throughout  Abdominal:     Palpations: Abdomen is soft.     Tenderness: There is no abdominal tenderness. There is  no guarding or rebound.  Lymphadenopathy:     Cervical: No cervical adenopathy.     Right cervical: No superficial cervical adenopathy.    Left cervical: No superficial cervical adenopathy.  Neurological:     General: No focal deficit present.     Mental Status: She is  alert and oriented to person, place, and time.  Psychiatric:        Mood and Affect: Mood normal.        Behavior: Behavior normal.        Thought Content: Thought content normal.        Judgment: Judgment normal.     UC Treatments / Results  Labs (all labs ordered are listed, but only abnormal results are displayed) Labs Reviewed - No data to display  EKG   Radiology No results found.  Procedures Procedures (including critical care time)  Medications Ordered in UC Medications - No data to display  Initial Impression / Assessment and Plan / UC Course  I have reviewed the triage vital signs and the nursing notes.  Pertinent labs & imaging results that were available during my care of the patient were reviewed by me and considered in my medical decision making (see chart for details).     This patient is a very pleasant 66 y.o. year old female presenting with COPD exacerbation/ sinusitis. Afebrile, nontachy.   Has already completed treatment with doxycycline. Augmentin sent today to cover for COPD exacerbation and sinusitis. Also refilled albuterol inhaler and nebulizer.   ED return precautions discussed. Patient verbalizes understanding and agreement.   Coding Level 4 for acute exacerbation of chronic condition and prescription drug management] .   Final Clinical Impressions(s) / UC Diagnoses   Final diagnoses:  COPD exacerbation ([place])  Acute non-recurrent maxillary sinusitis     Discharge Instructions      -Albuterol inhaler or nebulizer as needed for cough, wheezing, shortness of breath, 1 to 2 puffs every 6 hours as needed. -Start the antibiotic-Augmentin (amoxicillin-clavulanate), 1 pill  every 12 hours for 7 days.  You can take this with food like with breakfast and dinner. -Follow-up if symptoms persist      ED Prescriptions     Medication Sig Dispense Auth. Provider   albuterol (PROVENTIL) (2.5 MG/3ML) 0.083% nebulizer solution Take 3 mLs (2.5 mg total) by nebulization every 4 (four) hours as needed for wheezing. 75 mL Hazel Sams, PA-C   albuterol (VENTOLIN HFA) 108 (90 Base) MCG/ACT inhaler Inhale 2 puffs into the lungs every 6 (six) hours as needed for wheezing or shortness of breath. 1 each Hazel Sams, PA-C   amoxicillin-clavulanate (AUGMENTIN) 875-125 MG tablet Take 1 tablet by mouth every 12 (twelve) hours. 14 tablet Hazel Sams, PA-C      PDMP not reviewed this encounter.   Hazel Sams, PA-C 04/29/21 1338

## 2021-04-29 NOTE — Discharge Instructions (Addendum)
-  Albuterol inhaler or nebulizer as needed for cough, wheezing, shortness of breath, 1 to 2 puffs every 6 hours as needed. -Start the antibiotic-Augmentin (amoxicillin-clavulanate), 1 pill every 12 hours for 7 days.  You can take this with food like with breakfast and dinner. -Follow-up if symptoms persist

## 2021-06-07 ENCOUNTER — Encounter (HOSPITAL_COMMUNITY): Payer: Self-pay

## 2021-06-07 ENCOUNTER — Ambulatory Visit (HOSPITAL_COMMUNITY)
Admission: EM | Admit: 2021-06-07 | Discharge: 2021-06-07 | Disposition: A | Discharge status: Home / Self Care | Payer: Medicare HMO | Attending: Physician Assistant | Admitting: Physician Assistant

## 2021-06-07 DIAGNOSIS — Z760 Encounter for issue of repeat prescription: Secondary | ICD-10-CM | POA: Diagnosis not present

## 2021-06-07 DIAGNOSIS — J441 Chronic obstructive pulmonary disease with (acute) exacerbation: Secondary | ICD-10-CM

## 2021-06-07 MED ORDER — ALBUTEROL SULFATE HFA 108 (90 BASE) MCG/ACT IN AERS: 1.0000 | INHALATION_SPRAY | RESPIRATORY_TRACT | 1 refills | Status: DC | PRN

## 2021-06-07 MED ORDER — PREDNISONE 20 MG PO TABS: 40.0000 mg | ORAL_TABLET | Freq: Every day | ORAL | 0 refills | Status: AC

## 2021-06-07 MED ORDER — ALBUTEROL SULFATE (2.5 MG/3ML) 0.083% IN NEBU: 2.5000 mg | INHALATION_SOLUTION | RESPIRATORY_TRACT | 1 refills | Status: DC | PRN

## 2021-06-07 MED ORDER — BUDESONIDE-FORMOTEROL FUMARATE 160-4.5 MCG/ACT IN AERO: 2.0000 | INHALATION_SPRAY | Freq: Two times a day (BID) | RESPIRATORY_TRACT | 1 refills | Status: DC

## 2021-06-07 NOTE — ED Provider Notes (Addendum)
?[facility] ? ? ? ?CSN: [id number] ?Arrival date & time: 06/07/21  1452 ? ? ?  ? ?History   ?Chief Complaint ?Chief Complaint  ?Patient presents with  ? Medication Refill  ? ? ?HPI ?Phyllis Price is a 66 y.o. female.  ? ?Patient presents today requesting refill of albuterol medications for COPD.  She has a history of both COPD and asthma.  She has been seen by clinic several times for COPD exacerbation and reports that symptoms temporarily improved.  She is out of her albuterol nebulizer solution and inhaler.  She is requesting refills of these medications today.  She believes that she is taking a maintenance medication in the past but does not member the name of this and has not had it in more than 6 months.  She does continue to have cough and shortness of breath but denies any fever.  She denies any known sick contacts.  She is a current everyday smoker smoking her typical amount. ? ? ?Past Medical History:  ?Diagnosis Date  ? Asthma   ? Chronic obstructive asthma with exacerbation (HCC)   ? Hx of varicella   ? Pneumonia   ? Thyroid disease   ? Tobacco use   ? ? ?Patient Active Problem List  ? Diagnosis Date Noted  ? Family hx of lung cancer 01/28/2012  ? Family history of colon cancer 01/28/2012  ? CHRONIC OBSTRUCTIVE ASTHMA WITH EXACERBATION 02/15/2008  ? TOBACCO USE 12/27/2007  ? HYPOTHYROIDISM 11/13/2006  ? ASTHMA 11/13/2006  ? ? ?Past Surgical History:  ?Procedure Laterality Date  ? Denies    ? ? ?OB History   ?No obstetric history on file. ?  ? ? ? ?Home Medications   ? ?Prior to Admission medications   ?Medication Sig Start Date End Date Taking? Authorizing Provider  ?budesonide-formoterol (SYMBICORT) 160-4.5 MCG/ACT inhaler Inhale 2 puffs into the lungs in the morning and at bedtime. 06/07/21  Yes Jibril Mcminn K, PA-C  ?predniSONE (DELTASONE) 20 MG tablet Take 2 tablets (40 mg total) by mouth daily for 4 days. 06/07/21 06/11/21 Yes Lemar Bakos K, PA-C  ?albuterol (PROVENTIL) (2.5 MG/3ML)  0.083% nebulizer solution Take 3 mLs (2.5 mg total) by nebulization every 4 (four) hours as needed for wheezing. 06/07/21 06/07/22  Arlow Spiers, Noberto Retort, PA-C  ?albuterol (VENTOLIN HFA) 108 (90 Base) MCG/ACT inhaler Inhale 1-2 puffs into the lungs every 4 (four) hours as needed for wheezing or shortness of breath. 06/07/21   Jacquel Redditt K, PA-C  ?guaiFENesin-codeine (GUIATUSS AC) 100-10 MG/5ML syrup Take 10 mLs by mouth 4 (four) times daily as needed for cough. 05/19/13   Reuben Likes, MD  ?levothyroxine (SYNTHROID, LEVOTHROID) 100 MCG tablet Take 1 tablet (100 mcg total) by mouth daily. 12/27/11 12/26/12  Panosh, Neta Mends, MD  ? ? ?Family History ?Family History  ?Problem Relation Age of Onset  ? Cancer Father   ?     Lung  ? Heart failure Mother   ?     Valve Disease  ? Cancer Mother   ?     colon cancer   ? Hypertension Mother   ? ? ?Social History ?Social History  ? ?Tobacco Use  ? Smoking status: Every Day  ?  Types: Cigarettes  ?Substance Use Topics  ? Alcohol use: Yes  ?  Comment: ocass use  ? Drug use: No  ? ? ? ?Allergies   ?Codeine ? ? ?Review of Systems ?Review of Systems  ?Constitutional:  Positive for activity  change. Negative for appetite change, fatigue and fever.  ?HENT:  Negative for congestion, sinus pressure, sneezing and sore throat.   ?Respiratory:  Positive for cough, chest tightness and shortness of breath.   ?Cardiovascular:  Negative for chest pain.  ?Gastrointestinal:  Negative for abdominal pain, diarrhea, nausea and vomiting.  ?Neurological:  Negative for dizziness, light-headedness and headaches.  ? ? ?Physical Exam ?Triage Vital Signs ?ED Triage Vitals  ?Enc Vitals Group  ?   BP 06/07/21 1559 106/74  ?   Pulse Rate 06/07/21 1559 88  ?   Resp 06/07/21 1559 16  ?   Temp 06/07/21 1559 98.3 ?F (36.8 ?C)  ?   Temp Source 06/07/21 1559 Oral  ?   SpO2 06/07/21 1559 92 %  ?   Weight --   ?   Height --   ?   Head Circumference --   ?   Peak Flow --   ?   Pain Score 06/07/21 1558 0  ?   Pain Loc --   ?    Pain Edu? --   ?   Excl. in GC? --   ? ?No data found. ? ?Updated Vital Signs ?BP 106/74 (BP Location: Left Arm)   Pulse 88   Temp 98.3 ?F (36.8 ?C) (Oral)   Resp 16   SpO2 92%  ? ?Visual Acuity ?Right Eye Distance:   ?Left Eye Distance:   ?Bilateral Distance:   ? ?Right Eye Near:   ?Left Eye Near:    ?Bilateral Near:    ? ?Physical Exam ?Vitals reviewed.  ?Constitutional:   ?   General: She is awake. She is not in acute distress. ?   Appearance: Normal appearance. She is well-developed. She is not ill-appearing.  ?   Comments: Very pleasant female appears stated age in no acute distress  ?HENT:  ?   Head: Normocephalic and atraumatic.  ?Cardiovascular:  ?   Rate and Rhythm: Normal rate and regular rhythm.  ?   Heart sounds: Normal heart sounds, S1 normal and S2 normal. No murmur heard. ?Pulmonary:  ?   Effort: Pulmonary effort is normal.  ?   Breath sounds: Wheezing and rhonchi present. No rales.  ?   Comments: Widespread wheezing and rhonchi. ?Abdominal:  ?   General: Bowel sounds are normal.  ?   Palpations: Abdomen is soft.  ?   Tenderness: There is no abdominal tenderness. There is no right CVA tenderness, left CVA tenderness, guarding or rebound.  ?Psychiatric:     ?   Behavior: Behavior is cooperative.  ? ? ? ?UC Treatments / Results  ?Labs ?(all labs ordered are listed, but only abnormal results are displayed) ?Labs Reviewed - No data to display ? ?EKG ? ? ?Radiology ?No results found. ? ?Procedures ?Procedures (including critical care time) ? ?Medications Ordered in UC ?Medications - No data to display ? ?Initial Impression / Assessment and Plan / UC Course  ?I have reviewed the triage vital signs and the nursing notes. ? ?Pertinent labs & imaging results that were available during my care of the patient were reviewed by me and considered in my medical decision making (see chart for details). ? ?  ? ?Offered patient in clinic nebulizer treatment but she declined this due to concern for time in clinic.   Reports that she is confident her symptoms will improve with restarting her medication.  Refills were sent to pharmacy.  Prednisone burst sent to pharmacy.  Discussed potential utility of restarting  maintenance medication and Symbicort was sent to pharmacy.  Discussed if she is able to use this she would need to rinse her mouth following use to prevent thrush.  She is scheduled to see PCP within 6 weeks and strongly encouraged to keep this appointment.  Recommended over-the-counter medication use including Mucinex.  She is to rest and drink plenty of fluid.  Discussed that if she has any worsening symptoms including shortness of breath, high fever, nausea, vomiting, increased bleeding production, worsening cough she needs to be seen immediately.  Strict return precautions given to which she expressed understanding. ? ?Final Clinical Impressions(s) / UC Diagnoses  ? ?Final diagnoses:  ?COPD exacerbation (HCC)  ?Medication refill  ? ? ? ?Discharge Instructions   ? ?  ?I have sent in refills of your albuterol nebulizer solution as well as inhaler.  Please use this as previously prescribed.  I do think you benefit from Symbicort.  Please use this twice daily if you are able to get it from the pharmacy.  Make sure to rinse your mouth with water and spit that after using this medicine to prevent thrush.  Follow-up with your PCP as scheduled.  If anything worsens you have fever, worsening cough, increased sputum production, shortness of breath, persistent symptoms despite medication you should be seen immediately. ? ? ? ? ?ED Prescriptions   ? ? Medication Sig Dispense Auth. Provider  ? albuterol (PROVENTIL) (2.5 MG/3ML) 0.083% nebulizer solution Take 3 mLs (2.5 mg total) by nebulization every 4 (four) hours as needed for wheezing. 75 mL Mersadez Linden K, PA-C  ? albuterol (VENTOLIN HFA) 108 (90 Base) MCG/ACT inhaler Inhale 1-2 puffs into the lungs every 4 (four) hours as needed for wheezing or shortness of breath. 1 each  Yanissa Michalsky K, PA-C  ? budesonide-formoterol (SYMBICORT) 160-4.5 MCG/ACT inhaler Inhale 2 puffs into the lungs in the morning and at bedtime. 10.2 g Bradin Mcadory K, PA-C  ? predniSONE (DELTASONE) 20 MG tablet Ta

## 2021-06-07 NOTE — Discharge Instructions (Signed)
I have sent in refills of your albuterol nebulizer solution as well as inhaler.  Please use this as previously prescribed.  I do think you benefit from Symbicort.  Please use this twice daily if you are able to get it from the pharmacy.  Make sure to rinse your mouth with water and spit that after using this medicine to prevent thrush.  Follow-up with your PCP as scheduled.  If anything worsens you have fever, worsening cough, increased sputum production, shortness of breath, persistent symptoms despite medication you should be seen immediately. ?

## 2021-06-07 NOTE — ED Triage Notes (Signed)
Pt presents for refill of albuterol. States she sees her PCP in 6 weeks.  ?

## 2021-08-26 ENCOUNTER — Telehealth: Payer: Medicare HMO | Admitting: Physician Assistant

## 2021-08-26 DIAGNOSIS — J418 Mixed simple and mucopurulent chronic bronchitis: Secondary | ICD-10-CM | POA: Diagnosis not present

## 2021-08-26 MED ORDER — BUDESONIDE-FORMOTEROL FUMARATE 160-4.5 MCG/ACT IN AERO: 2.0000 | INHALATION_SPRAY | Freq: Two times a day (BID) | RESPIRATORY_TRACT | 1 refills | Status: DC

## 2021-08-26 MED ORDER — ALBUTEROL SULFATE (2.5 MG/3ML) 0.083% IN NEBU: 2.5000 mg | INHALATION_SOLUTION | RESPIRATORY_TRACT | 0 refills | Status: DC | PRN

## 2021-08-26 MED ORDER — ALBUTEROL SULFATE HFA 108 (90 BASE) MCG/ACT IN AERS: 1.0000 | INHALATION_SPRAY | RESPIRATORY_TRACT | 1 refills | Status: DC | PRN

## 2021-08-26 NOTE — Progress Notes (Signed)
Virtual Visit Consent   Phyllis Price, you are scheduled for a virtual visit with a [facility] provider today. Just as with appointments in the office, your consent must be obtained to participate. Your consent will be active for this visit and any virtual visit you may have with one of our providers in the next 365 days. If you have a MyChart account, a copy of this consent can be sent to you electronically.  As this is a virtual visit, video technology does not allow for your provider to perform a traditional examination. This may limit your provider's ability to fully assess your condition. If your provider identifies any concerns that need to be evaluated in person or the need to arrange testing (such as labs, EKG, etc.), we will make arrangements to do so. Although advances in technology are sophisticated, we cannot ensure that it will always work on either your end or our end. If the connection with a video visit is poor, the visit may have to be switched to a telephone visit. With either a video or telephone visit, we are not always able to ensure that we have a secure connection.  By engaging in this virtual visit, you consent to the provision of healthcare and authorize for your insurance to be billed (if applicable) for the services provided during this visit. Depending on your insurance coverage, you may receive a charge related to this service.  I need to obtain your verbal consent now. Are you willing to proceed with your visit today? Phyllis Price has provided verbal consent on 08/26/2021 for a virtual visit (video or telephone). Margaretann Loveless, PA-C  Date: 08/26/2021 7:13 PM  Virtual Visit via Video Note   I, Margaretann Loveless, connected with  Phyllis Price  ([id number], 04/27/1955) on 08/26/21 at  7:00 PM EDT by a video-enabled telemedicine application and verified that I am speaking with the correct person using two identifiers.  Location: Patient: Virtual Visit  Location Patient: Home Provider: Virtual Visit Location Provider: Home Office   I discussed the limitations of evaluation and management by telemedicine and the availability of in person appointments. The patient expressed understanding and agreed to proceed.    History of Present Illness: Phyllis Price is a 66 y.o. who identifies as a female who was assigned female at birth, and is being seen today for medication refills on her inhalers.   Problems:  Patient Active Problem List   Diagnosis Date Noted   Family hx of lung cancer 01/28/2012   Family history of colon cancer 01/28/2012   CHRONIC OBSTRUCTIVE ASTHMA WITH EXACERBATION 02/15/2008   TOBACCO USE 12/27/2007   HYPOTHYROIDISM 11/13/2006   ASTHMA 11/13/2006    Allergies:  Allergies  Allergen Reactions   Codeine    Medications:  Current Outpatient Medications:    albuterol (PROVENTIL) (2.5 MG/3ML) 0.083% nebulizer solution, Take 3 mLs (2.5 mg total) by nebulization every 4 (four) hours as needed for wheezing., Disp: 300 mL, Rfl: 0   albuterol (VENTOLIN HFA) 108 (90 Base) MCG/ACT inhaler, Inhale 1-2 puffs into the lungs every 4 (four) hours as needed for wheezing or shortness of breath., Disp: 18 g, Rfl: 1   budesonide-formoterol (SYMBICORT) 160-4.5 MCG/ACT inhaler, Inhale 2 puffs into the lungs in the morning and at bedtime., Disp: 10.2 g, Rfl: 1   guaiFENesin-codeine (GUIATUSS AC) 100-10 MG/5ML syrup, Take 10 mLs by mouth 4 (four) times daily as needed for cough., Disp: 120 mL, Rfl: 0   levothyroxine (SYNTHROID, LEVOTHROID) 100 MCG  tablet, Take 1 tablet (100 mcg total) by mouth daily., Disp: 90 tablet, Rfl: 0  Observations/Objective: Patient is well-developed, well-nourished in no acute distress.  Resting comfortably at home.  Head is normocephalic, atraumatic.  No labored breathing.  Speech is clear and coherent with logical content.  Patient is alert and oriented at baseline.    Assessment and Plan: 1. Mixed simple  and mucopurulent chronic bronchitis (HCC) - budesonide-formoterol (SYMBICORT) 160-4.5 MCG/ACT inhaler; Inhale 2 puffs into the lungs in the morning and at bedtime.  Dispense: 10.2 g; Refill: 1 - albuterol (VENTOLIN HFA) 108 (90 Base) MCG/ACT inhaler; Inhale 1-2 puffs into the lungs every 4 (four) hours as needed for wheezing or shortness of breath.  Dispense: 18 g; Refill: 1  - medications refilled - Discussed establishing with PCP (Link in AVS) for long term management  Follow Up Instructions: I discussed the assessment and treatment plan with the patient. The patient was provided an opportunity to ask questions and all were answered. The patient agreed with the plan and demonstrated an understanding of the instructions.  A copy of instructions were sent to the patient via MyChart unless otherwise noted below.    The patient was advised to call back or seek an in-person evaluation if the symptoms worsen or if the condition fails to improve as anticipated.  Time:  I spent 12 minutes with the patient via telehealth technology discussing the above problems/concerns.    Margaretann Loveless, PA-C

## 2021-08-26 NOTE — Patient Instructions (Signed)
  Zigmund Daniel Price, thank you for joining Mar Daring, PA-C for today's virtual visit.  While this provider is not your primary care provider (PCP), if your PCP is located in our provider database this encounter information will be shared with them immediately following your visit.  Consent: (Patient) Phyllis Price provided verbal consent for this virtual visit at the beginning of the encounter.  Current Medications:  Current Outpatient Medications:    albuterol (PROVENTIL) (2.5 MG/3ML) 0.083% nebulizer solution, Take 3 mLs (2.5 mg total) by nebulization every 4 (four) hours as needed for wheezing., Disp: 300 mL, Rfl: 0   albuterol (VENTOLIN HFA) 108 (90 Base) MCG/ACT inhaler, Inhale 1-2 puffs into the lungs every 4 (four) hours as needed for wheezing or shortness of breath., Disp: 18 g, Rfl: 1   budesonide-formoterol (SYMBICORT) 160-4.5 MCG/ACT inhaler, Inhale 2 puffs into the lungs in the morning and at bedtime., Disp: 10.2 g, Rfl: 1   guaiFENesin-codeine (GUIATUSS AC) 100-10 MG/5ML syrup, Take 10 mLs by mouth 4 (four) times daily as needed for cough., Disp: 120 mL, Rfl: 0   levothyroxine (SYNTHROID, LEVOTHROID) 100 MCG tablet, Take 1 tablet (100 mcg total) by mouth daily., Disp: 90 tablet, Rfl: 0   Medications ordered in this encounter:  Meds ordered this encounter  Medications   budesonide-formoterol (SYMBICORT) 160-4.5 MCG/ACT inhaler    Sig: Inhale 2 puffs into the lungs in the morning and at bedtime.    Dispense:  10.2 g    Refill:  1    Order Specific Question:   Supervising Provider    Answer:   MILLER, BRIAN [[id number]]   albuterol (VENTOLIN HFA) 108 (90 Base) MCG/ACT inhaler    Sig: Inhale 1-2 puffs into the lungs every 4 (four) hours as needed for wheezing or shortness of breath.    Dispense:  18 g    Refill:  1    Order Specific Question:   Supervising Provider    Answer:   MILLER, BRIAN [[id number]]   albuterol (PROVENTIL) (2.5 MG/3ML) 0.083% nebulizer solution    Sig:  Take 3 mLs (2.5 mg total) by nebulization every 4 (four) hours as needed for wheezing.    Dispense:  300 mL    Refill:  0    Order Specific Question:   Supervising Provider    Answer:   Sabra Heck, BRIAN [[id number]]     *If you need refills on other medications prior to your next appointment, please contact your pharmacy*  Follow-Up: Call back or seek an in-person evaluation if the symptoms worsen or if the condition fails to improve as anticipated.  Other Instructions Establish with a Primary Care Provider for long-term management of chronic medications   If you have been instructed to have an in-person evaluation today at a local Urgent Care facility, please use the link below. It will take you to a list of all of our available [facility], including address, phone number and hours of operation. Please do not delay care.  [facility]  If you or a family member do not have a primary care provider, use the link below to schedule a visit and establish care. When you choose a [facility] primary care physician or advanced practice provider, you gain a long-term partner in health. Find a Primary Care Provider  Learn more about [facility]'s in-office and virtual care options: [facility]

## 2021-08-31 MED ORDER — FLUTICASONE-SALMETEROL 250-50 MCG/ACT IN AEPB: 1.0000 | INHALATION_SPRAY | Freq: Two times a day (BID) | RESPIRATORY_TRACT | 0 refills | Status: DC

## 2021-08-31 MED ORDER — SYMBICORT 160-4.5 MCG/ACT IN AERO: 2.0000 | INHALATION_SPRAY | Freq: Two times a day (BID) | RESPIRATORY_TRACT | 0 refills | Status: DC

## 2021-08-31 NOTE — Addendum Note (Signed)
Addended by: Margaretann Loveless on: 08/31/2021 03:14 PM   Modules accepted: Orders

## 2021-08-31 NOTE — Addendum Note (Signed)
Addended by: Margaretann Loveless on: 08/31/2021 07:25 AM   Modules accepted: Orders

## 2021-11-24 ENCOUNTER — Telehealth: Payer: Medicare HMO | Admitting: Physician Assistant

## 2021-11-24 NOTE — Progress Notes (Signed)
The patient no-showed for appointment despite this provider sending direct link, reaching out via phone with no response and waiting for at least 10 minutes from appointment time for patient to join. They will be marked as a NS for this appointment/time.  ? ?Zaviyar Rahal Cody Norelle Runnion, PA-C ? ? ? ?

## 2022-02-08 ENCOUNTER — Telehealth: Payer: Medicare HMO | Admitting: Physician Assistant

## 2022-02-08 DIAGNOSIS — J418 Mixed simple and mucopurulent chronic bronchitis: Secondary | ICD-10-CM | POA: Diagnosis not present

## 2022-02-08 MED ORDER — ALBUTEROL SULFATE (2.5 MG/3ML) 0.083% IN NEBU: 2.5000 mg | INHALATION_SOLUTION | RESPIRATORY_TRACT | 0 refills | Status: DC | PRN

## 2022-02-08 MED ORDER — SYMBICORT 160-4.5 MCG/ACT IN AERO: 2.0000 | INHALATION_SPRAY | Freq: Two times a day (BID) | RESPIRATORY_TRACT | 0 refills | Status: DC

## 2022-02-08 MED ORDER — PULSE OXIMETER DELUXE MISC: 0 refills | Status: AC

## 2022-02-08 MED ORDER — ALBUTEROL SULFATE HFA 108 (90 BASE) MCG/ACT IN AERS: 1.0000 | INHALATION_SPRAY | RESPIRATORY_TRACT | 1 refills | Status: DC | PRN

## 2022-02-08 NOTE — Progress Notes (Signed)
Virtual Visit Consent   Phyllis Price, you are scheduled for a virtual visit with a [facility] provider today. Just as with appointments in the office, your consent must be obtained to participate. Your consent will be active for this visit and any virtual visit you may have with one of our providers in the next 365 days. If you have a MyChart account, a copy of this consent can be sent to you electronically.  As this is a virtual visit, video technology does not allow for your provider to perform a traditional examination. This may limit your provider's ability to fully assess your condition. If your provider identifies any concerns that need to be evaluated in person or the need to arrange testing (such as labs, EKG, etc.), we will make arrangements to do so. Although advances in technology are sophisticated, we cannot ensure that it will always work on either your end or our end. If the connection with a video visit is poor, the visit may have to be switched to a telephone visit. With either a video or telephone visit, we are not always able to ensure that we have a secure connection.  By engaging in this virtual visit, you consent to the provision of healthcare and authorize for your insurance to be billed (if applicable) for the services provided during this visit. Depending on your insurance coverage, you may receive a charge related to this service.  I need to obtain your verbal consent now. Are you willing to proceed with your visit today? Phyllis Price has provided verbal consent on 02/08/2022 for a virtual visit (video or telephone). Piedad Climes, [place]  Date: 02/08/2022 11:31 AM  Virtual Visit via Video Note   I, Piedad Climes, connected with  Phyllis Price  ([id number], 1955/05/27) on 02/08/22 at 11:30 AM EST by a video-enabled telemedicine application and verified that I am speaking with the correct person using two identifiers.  Location: Patient: Virtual Visit  Location Patient: Home Provider: Virtual Visit Location Provider: Home Office   I discussed the limitations of evaluation and management by telemedicine and the availability of in person appointments. The patient expressed understanding and agreed to proceed.    History of Present Illness: Phyllis Price is a 66 y.o. who identifies as a female who was assigned female at birth, and is being seen today for medication refill. Is out of her albuterol and Sybmicort, currently between PCP. Is worried about flare of her breathing if she does not stay consistent with Symbicort. Denies any fever, chills, malaise or fatigue.  HPI: HPI  Problems:  Patient Active Problem List   Diagnosis Date Noted   Family hx of lung cancer 01/28/2012   Family history of colon cancer 01/28/2012   CHRONIC OBSTRUCTIVE ASTHMA WITH EXACERBATION 02/15/2008   TOBACCO USE 12/27/2007   HYPOTHYROIDISM 11/13/2006   ASTHMA 11/13/2006    Allergies:  Allergies  Allergen Reactions   Codeine    Medications:  Current Outpatient Medications:    Misc. Devices (PULSE OXIMETER DELUXE) MISC, Use when needed to check oxygen status, Disp: 1 each, Rfl: 0   albuterol (PROVENTIL) (2.5 MG/3ML) 0.083% nebulizer solution, Take 3 mLs (2.5 mg total) by nebulization every 4 (four) hours as needed for wheezing., Disp: 300 mL, Rfl: 0   albuterol (VENTOLIN HFA) 108 (90 Base) MCG/ACT inhaler, Inhale 1-2 puffs into the lungs every 4 (four) hours as needed for wheezing or shortness of breath., Disp: 18 g, Rfl: 1   levothyroxine (SYNTHROID, LEVOTHROID) 100 MCG  tablet, Take 1 tablet (100 mcg total) by mouth daily., Disp: 90 tablet, Rfl: 0   SYMBICORT 160-4.5 MCG/ACT inhaler, Inhale 2 puffs into the lungs in the morning and at bedtime., Disp: 10.2 g, Rfl: 0  Observations/Objective: Patient is well-developed, well-nourished in no acute distress.  Resting comfortably at home.  Head is normocephalic, atraumatic.  No labored breathing.  Speech is  clear and coherent with logical content.  Patient is alert and oriented at baseline.   Assessment and Plan: 1. Mixed simple and mucopurulent chronic bronchitis (HCC) - albuterol (PROVENTIL) (2.5 MG/3ML) 0.083% nebulizer solution; Take 3 mLs (2.5 mg total) by nebulization every 4 (four) hours as needed for wheezing.  Dispense: 300 mL; Refill: 0 - albuterol (VENTOLIN HFA) 108 (90 Base) MCG/ACT inhaler; Inhale 1-2 puffs into the lungs every 4 (four) hours as needed for wheezing or shortness of breath.  Dispense: 18 g; Refill: 1 - SYMBICORT 160-4.5 MCG/ACT inhaler; Inhale 2 puffs into the lungs in the morning and at bedtime.  Dispense: 10.2 g; Refill: 0 - Misc. Devices (PULSE OXIMETER DELUXE) MISC; Use when needed to check oxygen status  Dispense: 1 each; Refill: 0  Out of medications. Medication refills provided for patient along with script for pulse ox so she can keep better check on things at home. Resources given to schedule with a new PCP.  Follow Up Instructions: I discussed the assessment and treatment plan with the patient. The patient was provided an opportunity to ask questions and all were answered. The patient agreed with the plan and demonstrated an understanding of the instructions.  A copy of instructions were sent to the patient via MyChart unless otherwise noted below.   The patient was advised to call back or seek an in-person evaluation if the symptoms worsen or if the condition fails to improve as anticipated.  Time:  I spent 10 minutes with the patient via telehealth technology discussing the above problems/concerns.    Piedad Climes, PA-C

## 2022-02-08 NOTE — Patient Instructions (Signed)
Della Goo, thank you for joining Piedad Climes, PA-C for today's virtual visit.  While this provider is not your primary care provider (PCP), if your PCP is located in our provider database this encounter information will be shared with them immediately following your visit.   A [facility] MyChart account gives you access to today's visit and all your visits, tests, and labs performed at [facility] " click here if you don't have a [facility] MyChart account or go to [email or web address]  Consent: (Patient) Phyllis Price provided verbal consent for this virtual visit at the beginning of the encounter.  Current Medications:  Current Outpatient Medications:    Misc. Devices (PULSE OXIMETER DELUXE) MISC, Use when needed to check oxygen status, Disp: 1 each, Rfl: 0   albuterol (PROVENTIL) (2.5 MG/3ML) 0.083% nebulizer solution, Take 3 mLs (2.5 mg total) by nebulization every 4 (four) hours as needed for wheezing., Disp: 300 mL, Rfl: 0   albuterol (VENTOLIN HFA) 108 (90 Base) MCG/ACT inhaler, Inhale 1-2 puffs into the lungs every 4 (four) hours as needed for wheezing or shortness of breath., Disp: 18 g, Rfl: 1   levothyroxine (SYNTHROID, LEVOTHROID) 100 MCG tablet, Take 1 tablet (100 mcg total) by mouth daily., Disp: 90 tablet, Rfl: 0   SYMBICORT 160-4.5 MCG/ACT inhaler, Inhale 2 puffs into the lungs in the morning and at bedtime., Disp: 10.2 g, Rfl: 0   Medications ordered in this encounter:  Meds ordered this encounter  Medications   albuterol (PROVENTIL) (2.5 MG/3ML) 0.083% nebulizer solution    Sig: Take 3 mLs (2.5 mg total) by nebulization every 4 (four) hours as needed for wheezing.    Dispense:  300 mL    Refill:  0    Order Specific Question:   Supervising Provider    Answer:   Merrilee Jansky [[id number]]   albuterol (VENTOLIN HFA) 108 (90 Base) MCG/ACT inhaler    Sig: Inhale 1-2 puffs into the lungs every 4 (four) hours as needed for wheezing or  shortness of breath.    Dispense:  18 g    Refill:  1    Order Specific Question:   Supervising Provider    Answer:   Merrilee Jansky [id number]   SYMBICORT 160-4.5 MCG/ACT inhaler    Sig: Inhale 2 puffs into the lungs in the morning and at bedtime.    Dispense:  10.2 g    Refill:  0    Symbicort brand only for coverage    Order Specific Question:   Supervising Provider    Answer:   Merrilee Jansky [id number]   Misc. Devices (PULSE OXIMETER DELUXE) MISC    Sig: Use when needed to check oxygen status    Dispense:  1 each    Refill:  0    Order Specific Question:   Supervising Provider    Answer:   Merrilee Jansky [id number]     *If you need refills on other medications prior to your next appointment, please contact your pharmacy*  Follow-Up: Call back or seek an in-person evaluation if the symptoms worsen or if the condition fails to improve as anticipated.  [facility] [phone]  Other Instructions Medications have been refilled. Please use link below to get scheduled with a new primary care provider.   If you have been instructed to have an in-person evaluation today at a local Urgent Care facility, please use the link below. It will take you to a list of all of  our available [facility], including address, phone number and hours of operation. Please do not delay care.  [facility]  If you or a family member do not have a primary care provider, use the link below to schedule a visit and establish care. When you choose a [facility] primary care physician or advanced practice provider, you gain a long-term partner in health. Find a Primary Care Provider  Learn more about [facility]'s in-office and virtual care options: [facility] - Get Care Now

## 2022-03-29 ENCOUNTER — Telehealth: Payer: Medicare HMO | Admitting: Physician Assistant

## 2022-03-29 DIAGNOSIS — J418 Mixed simple and mucopurulent chronic bronchitis: Secondary | ICD-10-CM

## 2022-03-29 DIAGNOSIS — J019 Acute sinusitis, unspecified: Secondary | ICD-10-CM | POA: Diagnosis not present

## 2022-03-29 DIAGNOSIS — B9689 Other specified bacterial agents as the cause of diseases classified elsewhere: Secondary | ICD-10-CM

## 2022-03-29 MED ORDER — ALBUTEROL SULFATE (2.5 MG/3ML) 0.083% IN NEBU: 2.5000 mg | INHALATION_SOLUTION | RESPIRATORY_TRACT | 0 refills | Status: DC | PRN

## 2022-03-29 MED ORDER — SYMBICORT 160-4.5 MCG/ACT IN AERO: 2.0000 | INHALATION_SPRAY | Freq: Two times a day (BID) | RESPIRATORY_TRACT | 0 refills | Status: DC

## 2022-03-29 MED ORDER — ALBUTEROL SULFATE HFA 108 (90 BASE) MCG/ACT IN AERS: 1.0000 | INHALATION_SPRAY | RESPIRATORY_TRACT | 0 refills | Status: DC | PRN

## 2022-03-29 MED ORDER — PREDNISONE 20 MG PO TABS: 40.0000 mg | ORAL_TABLET | Freq: Every day | ORAL | 0 refills | Status: DC

## 2022-03-29 MED ORDER — AMOXICILLIN-POT CLAVULANATE 875-125 MG PO TABS: 1.0000 | ORAL_TABLET | Freq: Two times a day (BID) | ORAL | 0 refills | Status: DC

## 2022-03-29 NOTE — Patient Instructions (Addendum)
Phyllis Price, thank you for joining Mar Daring, PA-C for today's virtual visit.  While this provider is not your primary care provider (PCP), if your PCP is located in our provider database this encounter information will be shared with them immediately following your visit.   [facility] account gives you access to today's visit and all your visits, tests, and labs performed at [facility] " click here if you don't have a [facility] account or go to [email or web address]  Consent: (Patient) Phyllis Price provided verbal consent for this virtual visit at the beginning of the encounter.  Current Medications:  Current Outpatient Medications:    amoxicillin-clavulanate (AUGMENTIN) 875-125 MG tablet, Take 1 tablet by mouth 2 (two) times daily., Disp: 20 tablet, Rfl: 0   predniSONE (DELTASONE) 20 MG tablet, Take 2 tablets (40 mg total) by mouth daily with breakfast., Disp: 10 tablet, Rfl: 0   albuterol (PROVENTIL) (2.5 MG/3ML) 0.083% nebulizer solution, Take 3 mLs (2.5 mg total) by nebulization every 4 (four) hours as needed for wheezing., Disp: 300 mL, Rfl: 0   albuterol (VENTOLIN HFA) 108 (90 Base) MCG/ACT inhaler, Inhale 1-2 puffs into the lungs every 4 (four) hours as needed for wheezing or shortness of breath., Disp: 18 g, Rfl: 0   levothyroxine (SYNTHROID, LEVOTHROID) 100 MCG tablet, Take 1 tablet (100 mcg total) by mouth daily., Disp: 90 tablet, Rfl: 0   Misc. Devices (PULSE OXIMETER DELUXE) MISC, Use when needed to check oxygen status, Disp: 1 each, Rfl: 0   SYMBICORT 160-4.5 MCG/ACT inhaler, Inhale 2 puffs into the lungs in the morning and at bedtime., Disp: 10.2 g, Rfl: 0   Medications ordered in this encounter:  Meds ordered this encounter  Medications   amoxicillin-clavulanate (AUGMENTIN) 875-125 MG tablet    Sig: Take 1 tablet by mouth 2 (two) times daily.    Dispense:  20 tablet    Refill:  0    Order Specific Question:    Supervising Provider    Answer:   Bari Mantis   albuterol (VENTOLIN HFA) 108 (90 Base) MCG/ACT inhaler    Sig: Inhale 1-2 puffs into the lungs every 4 (four) hours as needed for wheezing or shortness of breath.    Dispense:  18 g    Refill:  0    Order Specific Question:   Supervising Provider    Answer:   Chase Picket [id number]   albuterol (PROVENTIL) (2.5 MG/3ML) 0.083% nebulizer solution    Sig: Take 3 mLs (2.5 mg total) by nebulization every 4 (four) hours as needed for wheezing.    Dispense:  300 mL    Refill:  0    Order Specific Question:   Supervising Provider    Answer:   Chase Picket [id number]   SYMBICORT 160-4.5 MCG/ACT inhaler    Sig: Inhale 2 puffs into the lungs in the morning and at bedtime.    Dispense:  10.2 g    Refill:  0    Symbicort brand only for coverage    Order Specific Question:   Supervising Provider    Answer:   Chase Picket [[id number]]   predniSONE (DELTASONE) 20 MG tablet    Sig: Take 2 tablets (40 mg total) by mouth daily with breakfast.    Dispense:  10 tablet    Refill:  0    Order Specific Question:   Supervising Provider    Answer:   Chase Picket [[id number]]     *  If you need refills on other medications prior to your next appointment, please contact your pharmacy*  Follow-Up: Call back or seek an in-person evaluation if the symptoms worsen or if the condition fails to improve as anticipated.  [facility] [phone]  Other Instructions  Establish with a PCP using link below for further medication refills; Recommend to try to get appointment now and not wait until medications are low.    If you have been instructed to have an in-person evaluation today at a local Urgent Care facility, please use the link below. It will take you to a list of all of our available [facility], including address, phone number and hours of operation. Please do not delay care.  [facility]  If you or a family member do not have a primary care provider, use the link below to schedule a visit and establish care. When you choose a [facility] primary care physician or advanced practice provider, you gain a long-term partner in health. Find a Primary Care Provider  Learn more about [facility]'s in-office and virtual care options: [facility] Now

## 2022-03-29 NOTE — Progress Notes (Signed)
Virtual Visit Consent   Phyllis Price, you are scheduled for a virtual visit with a [facility] provider today. Just as with appointments in the office, your consent must be obtained to participate. Your consent will be active for this visit and any virtual visit you may have with one of our providers in the next 365 days. If you have a MyChart account, a copy of this consent can be sent to you electronically.  As this is a virtual visit, video technology does not allow for your provider to perform a traditional examination. This may limit your provider's ability to fully assess your condition. If your provider identifies any concerns that need to be evaluated in person or the need to arrange testing (such as labs, EKG, etc.), we will make arrangements to do so. Although advances in technology are sophisticated, we cannot ensure that it will always work on either your end or our end. If the connection with a video visit is poor, the visit may have to be switched to a telephone visit. With either a video or telephone visit, we are not always able to ensure that we have a secure connection.  By engaging in this virtual visit, you consent to the provision of healthcare and authorize for your insurance to be billed (if applicable) for the services provided during this visit. Depending on your insurance coverage, you may receive a charge related to this service.  I need to obtain your verbal consent now. Are you willing to proceed with your visit today? Phyllis Price has provided verbal consent on 03/29/2022 for a virtual visit (video or telephone). Mar Daring, PA-C  Date: 03/29/2022 11:42 AM  Virtual Visit via Video Note   I, Mar Daring, connected with  Phyllis Price  ([id number], 09/23/1955) on 03/29/22 at 11:30 AM EST by a video-enabled telemedicine application and verified that I am speaking with the correct person using two identifiers.  Location: Patient: Virtual Visit  Location Patient: Home Provider: Virtual Visit Location Provider: Home Office   I discussed the limitations of evaluation and management by telemedicine and the availability of in person appointments. The patient expressed understanding and agreed to proceed.    History of Present Illness: Phyllis Price is a 67 y.o. who identifies as a female who was assigned female at birth, and is being seen today for possible sinus infection and refill of asthma inhalers.  HPI: Sinusitis This is a new problem. The current episode started 1 to 4 weeks ago (2 weeks). The problem has been gradually worsening since onset. There has been no fever. Associated symptoms include congestion, coughing, diaphoresis (with coughing spells), headaches and sinus pressure. Pertinent negatives include no chills, ear pain, hoarse voice or sore throat. Past treatments include nothing. The treatment provided no relief.     Problems:  Patient Active Problem List   Diagnosis Date Noted   Family hx of lung cancer 01/28/2012   Family history of colon cancer 01/28/2012   CHRONIC OBSTRUCTIVE ASTHMA WITH EXACERBATION 02/15/2008   TOBACCO USE 12/27/2007   HYPOTHYROIDISM 11/13/2006   ASTHMA 11/13/2006    Allergies:  Allergies  Allergen Reactions   Codeine    Medications:  Current Outpatient Medications:    amoxicillin-clavulanate (AUGMENTIN) 875-125 MG tablet, Take 1 tablet by mouth 2 (two) times daily., Disp: 20 tablet, Rfl: 0   predniSONE (DELTASONE) 20 MG tablet, Take 2 tablets (40 mg total) by mouth daily with breakfast., Disp: 10 tablet, Rfl: 0   albuterol (PROVENTIL) (2.5 MG/3ML)  0.083% nebulizer solution, Take 3 mLs (2.5 mg total) by nebulization every 4 (four) hours as needed for wheezing., Disp: 300 mL, Rfl: 0   albuterol (VENTOLIN HFA) 108 (90 Base) MCG/ACT inhaler, Inhale 1-2 puffs into the lungs every 4 (four) hours as needed for wheezing or shortness of breath., Disp: 18 g, Rfl: 0   levothyroxine (SYNTHROID,  LEVOTHROID) 100 MCG tablet, Take 1 tablet (100 mcg total) by mouth daily., Disp: 90 tablet, Rfl: 0   Misc. Devices (PULSE OXIMETER DELUXE) MISC, Use when needed to check oxygen status, Disp: 1 each, Rfl: 0   SYMBICORT 160-4.5 MCG/ACT inhaler, Inhale 2 puffs into the lungs in the morning and at bedtime., Disp: 10.2 g, Rfl: 0  Observations/Objective: Patient is well-developed, well-nourished in no acute distress.  Resting comfortably at home.  Head is normocephalic, atraumatic.  No labored breathing.  Speech is clear and coherent with logical content.  Patient is alert and oriented at baseline.    Assessment and Plan: 1. Acute bacterial sinusitis - amoxicillin-clavulanate (AUGMENTIN) 875-125 MG tablet; Take 1 tablet by mouth 2 (two) times daily.  Dispense: 20 tablet; Refill: 0 - predniSONE (DELTASONE) 20 MG tablet; Take 2 tablets (40 mg total) by mouth daily with breakfast.  Dispense: 10 tablet; Refill: 0  2. Mixed simple and mucopurulent chronic bronchitis (HCC) - albuterol (VENTOLIN HFA) 108 (90 Base) MCG/ACT inhaler; Inhale 1-2 puffs into the lungs every 4 (four) hours as needed for wheezing or shortness of breath.  Dispense: 18 g; Refill: 0 - albuterol (PROVENTIL) (2.5 MG/3ML) 0.083% nebulizer solution; Take 3 mLs (2.5 mg total) by nebulization every 4 (four) hours as needed for wheezing.  Dispense: 300 mL; Refill: 0 - SYMBICORT 160-4.5 MCG/ACT inhaler; Inhale 2 puffs into the lungs in the morning and at bedtime.  Dispense: 10.2 g; Refill: 0 - predniSONE (DELTASONE) 20 MG tablet; Take 2 tablets (40 mg total) by mouth daily with breakfast.  Dispense: 10 tablet; Refill: 0  - Worsening symptoms that have not responded to OTC medications.  - Will give Augmentin - Asthma medications refilled, advised strict need to establish with a PCP as we are unable to refill her asthma medications any longer virtually until she has a face to face evaluation as this is our 3rd time refilling without a face  to face. Instructed patient to use link in AVS for scheduling with a PCP.  - Steam and humidifier can help - Stay well hydrated and get plenty of rest.  - Seek in person evaluation if no symptom improvement or if symptoms worsen   Follow Up Instructions: I discussed the assessment and treatment plan with the patient. The patient was provided an opportunity to ask questions and all were answered. The patient agreed with the plan and demonstrated an understanding of the instructions.  A copy of instructions were sent to the patient via MyChart unless otherwise noted below.    The patient was advised to call back or seek an in-person evaluation if the symptoms worsen or if the condition fails to improve as anticipated.  Time:  I spent 15 minutes with the patient via telehealth technology discussing the above problems/concerns.    Mar Daring, PA-C

## 2022-06-12 ENCOUNTER — Telehealth: Payer: Medicare HMO | Admitting: Physician Assistant

## 2022-06-12 DIAGNOSIS — J4489 Other specified chronic obstructive pulmonary disease: Secondary | ICD-10-CM

## 2022-06-12 NOTE — Patient Instructions (Signed)
  Zigmund Daniel Hutchins-Self, thank you for joining Mar Daring, PA-C for today's virtual visit.  While this provider is not your primary care provider (PCP), if your PCP is located in our provider database this encounter information will be shared with them immediately following your visit.   [facility] account gives you access to today's visit and all your visits, tests, and labs performed at [facility] " click here if you don't have a [facility] account or go to [email or web address]  Consent: (Patient) Lake[place] Hutchins-Self provided verbal consent for this virtual visit at the beginning of the encounter.  Current Medications:  Current Outpatient Medications:    albuterol (PROVENTIL) (2.5 MG/3ML) 0.083% nebulizer solution, Take 3 mLs (2.5 mg total) by nebulization every 4 (four) hours as needed for wheezing., Disp: 300 mL, Rfl: 0   albuterol (VENTOLIN HFA) 108 (90 Base) MCG/ACT inhaler, Inhale 1-2 puffs into the lungs every 4 (four) hours as needed for wheezing or shortness of breath., Disp: 18 g, Rfl: 0   levothyroxine (SYNTHROID, LEVOTHROID) 100 MCG tablet, Take 1 tablet (100 mcg total) by mouth daily., Disp: 90 tablet, Rfl: 0   Misc. Devices (PULSE OXIMETER DELUXE) MISC, Use when needed to check oxygen status, Disp: 1 each, Rfl: 0   SYMBICORT 160-4.5 MCG/ACT inhaler, Inhale 2 puffs into the lungs in the morning and at bedtime., Disp: 10.2 g, Rfl: 0   Medications ordered in this encounter:  No orders of the defined types were placed in this encounter.    *If you need refills on other medications prior to your next appointment, please contact your pharmacy*  Follow-Up: Call back or seek an in-person evaluation if the symptoms worsen or if the condition fails to improve as anticipated.  [facility] [phone]  Other Instructions Seek in person evaluation for further refills as advised previously   If you have been instructed to have  an in-person evaluation today at a local Urgent Care facility, please use the link below. It will take you to a list of all of our available [facility], including address, phone number and hours of operation. Please do not delay care.  [facility]  If you or a family member do not have a primary care provider, use the link below to schedule a visit and establish care. When you choose a [facility] primary care physician or advanced practice provider, you gain a long-term partner in health. Find a Primary Care Provider  Learn more about [facility]'s in-office and virtual care options: [facility]

## 2022-06-12 NOTE — Progress Notes (Signed)
Virtual Visit Consent   Phyllis Price, you are scheduled for a virtual visit with a [facility] provider today. Just as with appointments in the office, your consent must be obtained to participate. Your consent will be active for this visit and any virtual visit you may have with one of our providers in the next 365 days. If you have a MyChart account, a copy of this consent can be sent to you electronically.  As this is a virtual visit, video technology does not allow for your provider to perform a traditional examination. This may limit your provider's ability to fully assess your condition. If your provider identifies any concerns that need to be evaluated in person or the need to arrange testing (such as labs, EKG, etc.), we will make arrangements to do so. Although advances in technology are sophisticated, we cannot ensure that it will always work on either your end or our end. If the connection with a video visit is poor, the visit may have to be switched to a telephone visit. With either a video or telephone visit, we are not always able to ensure that we have a secure connection.  By engaging in this virtual visit, you consent to the provision of healthcare and authorize for your insurance to be billed (if applicable) for the services provided during this visit. Depending on your insurance coverage, you may receive a charge related to this service.  I need to obtain your verbal consent now. Are you willing to proceed with your visit today? Phyllis Price has provided verbal consent on 06/12/2022 for a virtual visit (video or telephone). Mar Daring, PA-C  Date: 06/12/2022 1:44 PM  Virtual Visit via Video Note   I, Mar Daring, connected with  Phyllis Price  ([id number], 08/16/1955) on 06/12/22 at  1:30 PM EDT by a video-enabled telemedicine application and verified that I am speaking with the correct person using two identifiers.  Location: Patient: Virtual Visit  Location Patient: Home Provider: Virtual Visit Location Provider: Home Office   I discussed the limitations of evaluation and management by telemedicine and the availability of in person appointments. The patient expressed understanding and agreed to proceed.    History of Present Illness: Phyllis Price is a 67 y.o. who identifies as a female who was assigned female at birth, and is being seen today for medication refills. Patient was seen in January and provided refills and was advised at that she would require in person evaluation before other refills. She has not been seen in person.   Problems:  Patient Active Problem List   Diagnosis Date Noted   Family hx of lung cancer 01/28/2012   Family history of colon cancer 01/28/2012   CHRONIC OBSTRUCTIVE ASTHMA WITH EXACERBATION 02/15/2008   TOBACCO USE 12/27/2007   HYPOTHYROIDISM 11/13/2006   ASTHMA 11/13/2006    Allergies:  Allergies  Allergen Reactions   Codeine    Medications:  Current Outpatient Medications:    albuterol (PROVENTIL) (2.5 MG/3ML) 0.083% nebulizer solution, Take 3 mLs (2.5 mg total) by nebulization every 4 (four) hours as needed for wheezing., Disp: 300 mL, Rfl: 0   albuterol (VENTOLIN HFA) 108 (90 Base) MCG/ACT inhaler, Inhale 1-2 puffs into the lungs every 4 (four) hours as needed for wheezing or shortness of breath., Disp: 18 g, Rfl: 0   levothyroxine (SYNTHROID, LEVOTHROID) 100 MCG tablet, Take 1 tablet (100 mcg total) by mouth daily., Disp: 90 tablet, Rfl: 0   Misc. Devices (PULSE OXIMETER DELUXE) MISC, Use  when needed to check oxygen status, Disp: 1 each, Rfl: 0   SYMBICORT 160-4.5 MCG/ACT inhaler, Inhale 2 puffs into the lungs in the morning and at bedtime., Disp: 10.2 g, Rfl: 0  Observations/Objective: Patient is well-developed, well-nourished in no acute distress.  Resting comfortably at home.  Head is normocephalic, atraumatic.  No labored breathing.  Speech is clear and coherent with logical  content.  Patient is alert and oriented at baseline.    Assessment and Plan: 1. Chronic obstructive asthma  - Patient has had multiple refills on her Symbicort and Albuterol from the virtual department - Was advised in January 2024 that she would need to establish with a PCP or be seen in person for any future refills - Was advised today for in person evaluation as this is department policy. Patient became frustrated and said "Okay, thank you" then ended the call   Follow Up Instructions: I discussed the assessment and treatment plan with the patient. The patient was provided an opportunity to ask questions and all were answered. The patient agreed with the plan and demonstrated an understanding of the instructions.  A copy of instructions were sent to the patient via MyChart unless otherwise noted below.    The patient was advised to call back or seek an in-person evaluation if the symptoms worsen or if the condition fails to improve as anticipated.  Time:  I spent 6 minutes with the patient via telehealth technology discussing the above problems/concerns.    Mar Daring, PA-C

## 2022-06-16 DIAGNOSIS — J45901 Unspecified asthma with (acute) exacerbation: Secondary | ICD-10-CM | POA: Diagnosis not present

## 2022-12-07 ENCOUNTER — Ambulatory Visit (HOSPITAL_COMMUNITY): Payer: Medicare HMO

## 2022-12-08 DIAGNOSIS — J449 Chronic obstructive pulmonary disease, unspecified: Secondary | ICD-10-CM | POA: Diagnosis not present

## 2022-12-08 DIAGNOSIS — F17200 Nicotine dependence, unspecified, uncomplicated: Secondary | ICD-10-CM | POA: Diagnosis not present

## 2023-04-30 ENCOUNTER — Emergency Department (HOSPITAL_BASED_OUTPATIENT_CLINIC_OR_DEPARTMENT_OTHER): Payer: Medicare HMO | Admitting: Radiology

## 2023-04-30 ENCOUNTER — Other Ambulatory Visit: Payer: Self-pay

## 2023-04-30 ENCOUNTER — Telehealth: Payer: Medicare HMO | Admitting: Physician Assistant

## 2023-04-30 ENCOUNTER — Encounter (HOSPITAL_BASED_OUTPATIENT_CLINIC_OR_DEPARTMENT_OTHER): Payer: Self-pay | Admitting: Emergency Medicine

## 2023-04-30 ENCOUNTER — Emergency Department (HOSPITAL_BASED_OUTPATIENT_CLINIC_OR_DEPARTMENT_OTHER)
Admission: EM | Admit: 2023-04-30 | Discharge: 2023-04-30 | Discharge status: Against Medical Advice | Payer: Medicare HMO | Attending: Emergency Medicine | Admitting: Emergency Medicine

## 2023-04-30 DIAGNOSIS — J441 Chronic obstructive pulmonary disease with (acute) exacerbation: Secondary | ICD-10-CM

## 2023-04-30 DIAGNOSIS — J418 Mixed simple and mucopurulent chronic bronchitis: Secondary | ICD-10-CM

## 2023-04-30 DIAGNOSIS — R0602 Shortness of breath: Secondary | ICD-10-CM | POA: Insufficient documentation

## 2023-04-30 DIAGNOSIS — Z5321 Procedure and treatment not carried out due to patient leaving prior to being seen by health care provider: Secondary | ICD-10-CM | POA: Insufficient documentation

## 2023-04-30 DIAGNOSIS — J449 Chronic obstructive pulmonary disease, unspecified: Secondary | ICD-10-CM | POA: Diagnosis not present

## 2023-04-30 LAB — RESP PANEL BY RT-PCR (RSV, FLU A&B, COVID)  RVPGX2
Influenza A by PCR: NEGATIVE
Influenza B by PCR: NEGATIVE
Resp Syncytial Virus by PCR: NEGATIVE
SARS Coronavirus 2 by RT PCR: NEGATIVE

## 2023-04-30 MED ORDER — ALBUTEROL SULFATE HFA 108 (90 BASE) MCG/ACT IN AERS
2.0000 | INHALATION_SPRAY | RESPIRATORY_TRACT | Status: DC | PRN
  Administered 2023-04-30: 2 via RESPIRATORY_TRACT

## 2023-04-30 MED ORDER — ALBUTEROL SULFATE HFA 108 (90 BASE) MCG/ACT IN AERS
INHALATION_SPRAY | RESPIRATORY_TRACT | Status: AC
  Filled 2023-04-30: qty 6.7

## 2023-04-30 MED ORDER — ALBUTEROL SULFATE HFA 108 (90 BASE) MCG/ACT IN AERS: 1.0000 | INHALATION_SPRAY | RESPIRATORY_TRACT | 0 refills | Status: DC | PRN

## 2023-04-30 MED ORDER — PREDNISONE 20 MG PO TABS: 40.0000 mg | ORAL_TABLET | Freq: Every day | ORAL | 0 refills | Status: AC

## 2023-04-30 MED ORDER — AZITHROMYCIN 250 MG PO TABS: ORAL_TABLET | ORAL | 0 refills | Status: AC

## 2023-04-30 MED ORDER — CETIRIZINE HCL 10 MG PO TABS
10.0000 mg | ORAL_TABLET | Freq: Every day | ORAL | 11 refills | Status: AC
Start: 2023-04-30 — End: ?

## 2023-04-30 MED ORDER — ALBUTEROL SULFATE (2.5 MG/3ML) 0.083% IN NEBU: 2.5000 mg | INHALATION_SOLUTION | RESPIRATORY_TRACT | 0 refills | Status: DC | PRN

## 2023-04-30 MED ORDER — IPRATROPIUM-ALBUTEROL 0.5-2.5 (3) MG/3ML IN SOLN
3.0000 mL | Freq: Once | RESPIRATORY_TRACT | Status: AC
  Administered 2023-04-30: 3 mL via RESPIRATORY_TRACT
  Filled 2023-04-30: qty 3

## 2023-04-30 MED ORDER — ALBUTEROL SULFATE (2.5 MG/3ML) 0.083% IN NEBU
2.5000 mg | INHALATION_SOLUTION | Freq: Once | RESPIRATORY_TRACT | Status: AC
  Administered 2023-04-30: 2.5 mg via RESPIRATORY_TRACT
  Filled 2023-04-30: qty 3

## 2023-04-30 MED ORDER — BUDESONIDE-FORMOTEROL FUMARATE 160-4.5 MCG/ACT IN AERO: 2.0000 | INHALATION_SPRAY | Freq: Two times a day (BID) | RESPIRATORY_TRACT | 0 refills | Status: DC

## 2023-04-30 NOTE — ED Triage Notes (Signed)
 Feels like asthma is worse. Ran out of asthma meds 2 months ago. Does neb treatment but only has 2 left.

## 2023-04-30 NOTE — Patient Instructions (Signed)
 Phyllis Price, thank you for joining [name], PA-C for today's virtual visit.  While this provider is not your primary care provider (PCP), if your PCP is located in our provider database this encounter information will be shared with them immediately following your visit.   A [facility] MyChart account gives you access to today's visit and all your visits, tests, and labs performed at [facility] " click here if you don't have a [facility] MyChart account or go to [email or web address]  Consent: (Patient) Phyllis Price provided verbal consent for this virtual visit at the beginning of the encounter.  Current Medications:  Current Outpatient Medications:    azithromycin  (ZITHROMAX ) 250 MG tablet, Take 2 tablets on day 1, then 1 tablet daily on days 2 through 5, Disp: 6 tablet, Rfl: 0   cetirizine  (ZYRTEC  ALLERGY) 10 MG tablet, Take 1 tablet (10 mg total) by mouth daily., Disp: 30 tablet, Rfl: 11   predniSONE  (DELTASONE ) 20 MG tablet, Take 2 tablets (40 mg total) by mouth daily with breakfast for 5 days., Disp: 10 tablet, Rfl: 0   albuterol  (PROVENTIL ) (2.5 MG/3ML) 0.083% nebulizer solution, Take 3 mLs (2.5 mg total) by nebulization every 4 (four) hours as needed for wheezing., Disp: 300 mL, Rfl: 0   albuterol  (VENTOLIN  HFA) 108 (90 Base) MCG/ACT inhaler, Inhale 1-2 puffs into the lungs every 4 (four) hours as needed for wheezing or shortness of breath., Disp: 18 g, Rfl: 0   budesonide -formoterol  (SYMBICORT ) 160-4.5 MCG/ACT inhaler, Inhale 2 puffs into the lungs in the morning and at bedtime., Disp: 10.2 g, Rfl: 0   levothyroxine  (SYNTHROID , LEVOTHROID) 100 MCG tablet, Take 1 tablet (100 mcg total) by mouth daily., Disp: 90 tablet, Rfl: 0   Misc. Devices (PULSE OXIMETER DELUXE) MISC, Use when needed to check oxygen status, Disp: 1 each, Rfl: 0   Medications ordered in this encounter:  Meds ordered this encounter  Medications   albuterol  (VENTOLIN  HFA) 108 (90  Base) MCG/ACT inhaler    Sig: Inhale 1-2 puffs into the lungs every 4 (four) hours as needed for wheezing or shortness of breath.    Dispense:  18 g    Refill:  0    Supervising Provider:   LAMPTEY, PHILIP O [[id number]]   budesonide -formoterol  (SYMBICORT ) 160-4.5 MCG/ACT inhaler    Sig: Inhale 2 puffs into the lungs in the morning and at bedtime.    Dispense:  10.2 g    Refill:  0    Supervising Provider:   Corine Dice [[id number]]   azithromycin  (ZITHROMAX ) 250 MG tablet    Sig: Take 2 tablets on day 1, then 1 tablet daily on days 2 through 5    Dispense:  6 tablet    Refill:  0    Supervising Provider:   LAMPTEY, PHILIP O [[id number]]   predniSONE  (DELTASONE ) 20 MG tablet    Sig: Take 2 tablets (40 mg total) by mouth daily with breakfast for 5 days.    Dispense:  10 tablet    Refill:  0    Supervising Provider:   Corine Dice [[id number]]   cetirizine  (ZYRTEC  ALLERGY) 10 MG tablet    Sig: Take 1 tablet (10 mg total) by mouth daily.    Dispense:  30 tablet    Refill:  11    Supervising Provider:   LAMPTEY, PHILIP O [[id number]]   albuterol  (PROVENTIL ) (2.5 MG/3ML) 0.083% nebulizer solution    Sig: Take 3 mLs (2.5 mg total) by nebulization every  4 (four) hours as needed for wheezing.    Dispense:  300 mL    Refill:  0    Supervising Provider:   Corine Dice [[id number]]     *If you need refills on other medications prior to your next appointment, please contact your pharmacy*  Follow-Up: Call back or seek an in-person evaluation if the symptoms worsen or if the condition fails to improve as anticipated.  [facility] [phone]  Other Instructions Chronic Obstructive Pulmonary Disease Exacerbation  Chronic obstructive pulmonary disease (COPD) is a long-term (chronic) lung problem. When you have COPD, it can feel harder to breathe in or out. COPD exacerbation is a flare-up of symptoms when breathing gets worse and more treatment may be needed. Without  treatment, flare-ups can be life-threatening. If they happen often, your lungs can become more damaged. What are the causes? Not taking your usual COPD medicines as told by your health care provider. A cold or the flu, which can cause infection in your lungs. Being exposed to things that make your breathing worse, such as: Smoke. Air pollution. Fumes. Dust. Allergies. Weather changes. What are the signs or symptoms? Symptoms do not get better or get worse even if you take your medicines as told by your provider. Symptoms may include: More shortness of breath. You may only be able to speak one or two words at a time. More coughing or mucus from your lungs. More wheezing or chest tightness. Being more tired and having less energy. Confusion. How is this diagnosed? This condition is diagnosed based on: Symptoms that get worse. Your medical history. A physical exam. You may also have tests, including: A chest X-ray. Blood or mucus tests. How is this treated? You may be able to stay home or you may need to go to the hospital. Treatment may include: Taking medicines. These may include: Inhalers. These have medicines in them that you breathe in. These may be more of what you already take or they may be new. Steroids. These reduce inflammation in the airways. These may be inhaled, taken by mouth, or given in an IV. Antibiotics. These treat infection. Using oxygen. Using a device to help you clear mucus. Follow these instructions at home: Medicines Take your medicines only as told by your provider. If you were given antibiotics or steroids, take them as told by your provider. Do not stop taking them even if you start to feel better. Lifestyle Several times a day, wash your hands with soap and water for at least 20 seconds. If you cannot use soap and water, use hand sanitizer. This may help keep you from getting an infection. Avoid being around crowds or people who are sick. Do not  smoke or use any products that contain nicotine or tobacco. If you need help quitting, ask your provider. Return to your normal activities when your provider says that it's safe. Use breathing methods to control your stress and catch your breath. How is this prevented? Follow your COPD action plan. The action plan tells you what to do if you're feeling good and what to do when you start feeling worse. Discuss the plan often with your provider. Make sure you get all the shots, also called vaccines, that your provider recommends. Ask your provider about a flu shot and a pneumonia shot. Use oxygen therapy if told by your provider. If you need home oxygen therapy, ask your provider how often to check your oxygen level with a device called an  oximeter. Keep all follow-up visits to review your COPD action plan. Your provider will want to check on your condition often to keep you healthy and out of the hospital. Contact a health care provider if: Your COPD symptoms get worse. You have a fever or chills. You have trouble doing daily activities. You have trouble breathing even when you are resting. Get help right away if: You are short of breath and cannot: Talk in full sentences. Do normal activities. You have chest pain. You feel confused. These symptoms may be an emergency. Call 911 right away. Do not wait to see if the symptoms will go away. Do not drive yourself to the hospital. This information is not intended to replace advice given to you by your health care provider. Make sure you discuss any questions you have with your health care provider. Document Revised: 12/07/2022 Document Reviewed: 05/22/2022 Elsevier Patient Education  2024 Elsevier Inc.   If you have been instructed to have an in-person evaluation today at a local Urgent Care facility, please use the link below. It will take you to a list of all of our available [facility], including address, phone number and hours  of operation. Please do not delay care.  [facility]  If you or a family member do not have a primary care provider, use the link below to schedule a visit and establish care. When you choose a [facility] primary care physician or advanced practice provider, you gain a long-term partner in health. Find a Primary Care Provider  Learn more about [facility]'s in-office and virtual care options: [facility] - Get Care Now

## 2023-04-30 NOTE — Progress Notes (Signed)
 Virtual Visit Consent   Phyllis Price, you are scheduled for a virtual visit with a [facility] provider today. Just as with appointments in the office, your consent must be obtained to participate. Your consent will be active for this visit and any virtual visit you may have with one of our providers in the next 365 days. If you have a MyChart account, a copy of this consent can be sent to you electronically.  As this is a virtual visit, video technology does not allow for your provider to perform a traditional examination. This may limit your provider's ability to fully assess your condition. If your provider identifies any concerns that need to be evaluated in person or the need to arrange testing (such as labs, EKG, etc.), we will make arrangements to do so. Although advances in technology are sophisticated, we cannot ensure that it will always work on either your end or our end. If the connection with a video visit is poor, the visit may have to be switched to a telephone visit. With either a video or telephone visit, we are not always able to ensure that we have a secure connection.  By engaging in this virtual visit, you consent to the provision of healthcare and authorize for your insurance to be billed (if applicable) for the services provided during this visit. Depending on your insurance coverage, you may receive a charge related to this service.  I need to obtain your verbal consent now. Are you willing to proceed with your visit today? Phyllis Price has provided verbal consent on 04/30/2023 for a virtual visit (video or telephone). [name], PA-C  Date: 04/30/2023 9:38 PM   Virtual Visit via Video Note   I, Phyllis Price, connected with  Phyllis Price  ([id number], June 12, 1955) on 04/30/23 at  9:30 PM EST by a video-enabled telemedicine application and verified that I am speaking with the correct person using two identifiers.  Location: Patient: Virtual Visit Location  Patient: Home Provider: Virtual Visit Location Provider: Home Office   I discussed the limitations of evaluation and management by telemedicine and the availability of in person appointments. The patient expressed understanding and agreed to proceed.    History of Present Illness: Phyllis Price is a 68 y.o. who identifies as a female who was assigned female at birth, with a history of asthma and COPD, presents with a sinus infection that has exacerbated her asthma. The symptoms started at the end of October, following exposure to tree cutting activities in her backyard. The patient describes a burning sensation in her nose, sometimes affecting both sides. Despite self-management with [place], a herbal supplement, and a tea to clear her lungs, the patient believes she has an infection. The patient also mentions having bad nerves and smokes between half a pack and a pack of cigarettes a day. She has been without a daily inhaler for about three months and has been managing with a nebulizer one-two times a day. The patient is concerned about developing pneumonia, given her history of COPD. Was seen in the ED earlier today but did not stay to complete treatment.  Negative for flu, covid and RSV.  Chest xray:   IMPRESSION: No active cardiopulmonary disease.  COPD.     Electronically Signed   By: Beula Brunswick M.D.   On: 04/30/2023 13:11     Problems:  Patient Active Problem List   Diagnosis Date Noted   Family hx of lung cancer 01/28/2012   Family history of  colon cancer 01/28/2012   CHRONIC OBSTRUCTIVE ASTHMA WITH EXACERBATION 02/15/2008   TOBACCO USE 12/27/2007   HYPOTHYROIDISM 11/13/2006   ASTHMA 11/13/2006    Allergies:  Allergies  Allergen Reactions   Codeine     Medications:  Current Outpatient Medications:    azithromycin  (ZITHROMAX ) 250 MG tablet, Take 2 tablets on day 1, then 1 tablet daily on days 2 through 5, Disp: 6 tablet, Rfl: 0   cetirizine  (ZYRTEC  ALLERGY) 10 MG  tablet, Take 1 tablet (10 mg total) by mouth daily., Disp: 30 tablet, Rfl: 11   predniSONE  (DELTASONE ) 20 MG tablet, Take 2 tablets (40 mg total) by mouth daily with breakfast for 5 days., Disp: 10 tablet, Rfl: 0   albuterol  (PROVENTIL ) (2.5 MG/3ML) 0.083% nebulizer solution, Take 3 mLs (2.5 mg total) by nebulization every 4 (four) hours as needed for wheezing., Disp: 300 mL, Rfl: 0   albuterol  (VENTOLIN  HFA) 108 (90 Base) MCG/ACT inhaler, Inhale 1-2 puffs into the lungs every 4 (four) hours as needed for wheezing or shortness of breath., Disp: 18 g, Rfl: 0   budesonide -formoterol  (SYMBICORT ) 160-4.5 MCG/ACT inhaler, Inhale 2 puffs into the lungs in the morning and at bedtime., Disp: 10.2 g, Rfl: 0   levothyroxine  (SYNTHROID , LEVOTHROID) 100 MCG tablet, Take 1 tablet (100 mcg total) by mouth daily., Disp: 90 tablet, Rfl: 0   Misc. Devices (PULSE OXIMETER DELUXE) MISC, Use when needed to check oxygen status, Disp: 1 each, Rfl: 0  Observations/Objective: Patient is well-developed, well-nourished in no acute distress.  Resting comfortably  at home.  Head is normocephalic, atraumatic.  No labored breathing.  Speech is clear and coherent with logical content.  Patient is alert and oriented at baseline.    Assessment and Plan: 1. CHRONIC OBSTRUCTIVE ASTHMA WITH EXACERBATION (Primary) - albuterol  (VENTOLIN  HFA) 108 (90 Base) MCG/ACT inhaler; Inhale 1-2 puffs into the lungs every 4 (four) hours as needed for wheezing or shortness of breath.  Dispense: 18 g; Refill: 0 - budesonide -formoterol  (SYMBICORT ) 160-4.5 MCG/ACT inhaler; Inhale 2 puffs into the lungs in the morning and at bedtime.  Dispense: 10.2 g; Refill: 0 - azithromycin  (ZITHROMAX ) 250 MG tablet; Take 2 tablets on day 1, then 1 tablet daily on days 2 through 5  Dispense: 6 tablet; Refill: 0 - predniSONE  (DELTASONE ) 20 MG tablet; Take 2 tablets (40 mg total) by mouth daily with breakfast for 5 days.  Dispense: 10 tablet; Refill: 0 -  cetirizine  (ZYRTEC  ALLERGY) 10 MG tablet; Take 1 tablet (10 mg total) by mouth daily.  Dispense: 30 tablet; Refill: 11  2. Mixed simple and mucopurulent chronic bronchitis (HCC) - albuterol  (PROVENTIL ) (2.5 MG/3ML) 0.083% nebulizer solution; Take 3 mLs (2.5 mg total) by nebulization every 4 (four) hours as needed for wheezing.  Dispense: 300 mL; Refill: 0  Chronic sinusitis since October, exacerbated by exposure to tree cutting and a recent upper respiratory infection. Negative for COVID, flu, and RSV. No acute findings on lung x-ray. Exacerbation of symptoms due to sinusitis. Currently using nebulizer treatments and recently started Symbicort . -Start Azithromycin  for suspected bacterial sinusitis. -Restart Symbicort  inhaler daily. -Refill Albuterol  inhaler for rescue use. -Refill nebulizer solution. -Start Zyrtec  once daily  .  Follow Up Instructions: I discussed the assessment and treatment plan with the patient. The patient was provided an opportunity to ask questions and all were answered. The patient agreed with the plan and demonstrated an understanding of the instructions.  A copy of instructions were sent to the patient via MyChart unless otherwise noted  below.     The patient was advised to call back or seek an in-person evaluation if the symptoms worsen or if the condition fails to improve as anticipated.    Etter Hermann Mayers, PA-C

## 2023-05-18 ENCOUNTER — Other Ambulatory Visit: Payer: Self-pay | Admitting: Physician Assistant

## 2023-05-18 DIAGNOSIS — J441 Chronic obstructive pulmonary disease with (acute) exacerbation: Secondary | ICD-10-CM

## 2023-05-19 IMAGING — DX DG CHEST 2V
2 series · 2 of 2 positions shown · non-contrast
Comparison: Chest radiograph 05/19/2013

CLINICAL DATA: Cough for 2 weeks

EXAM:
CHEST - 2 VIEW

[chest pa]
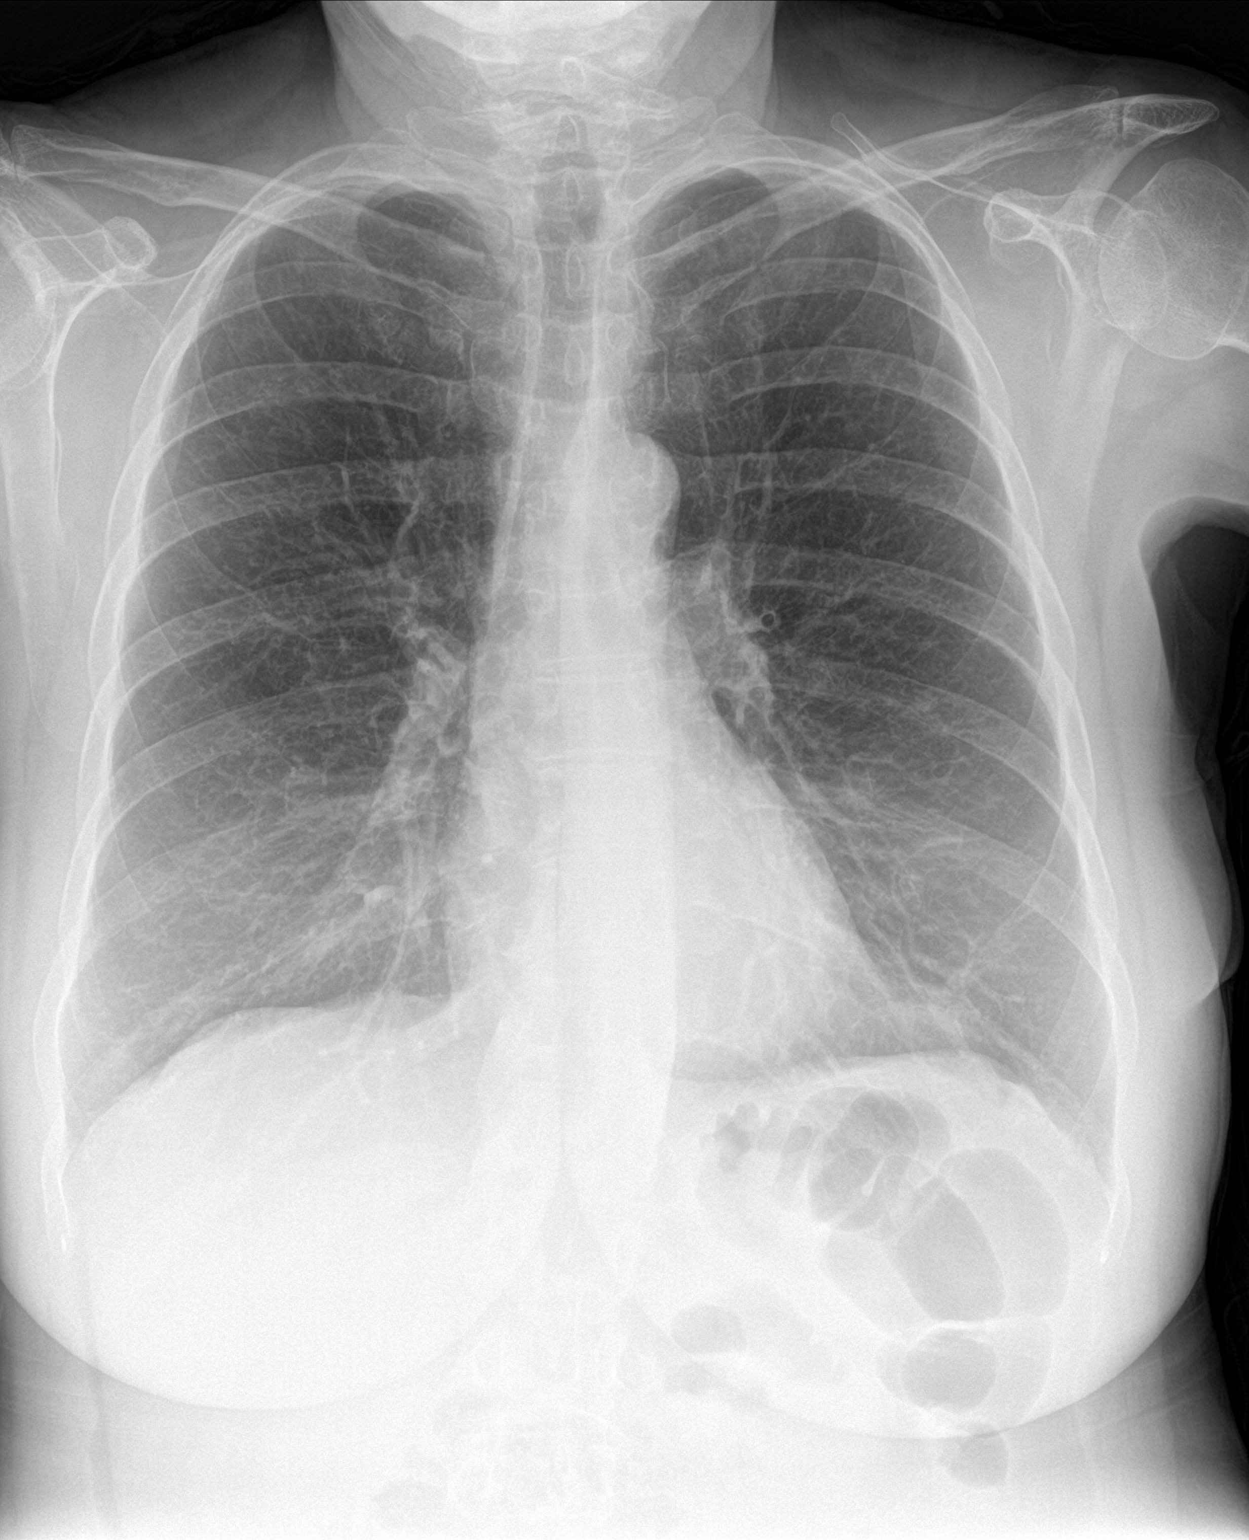

[chest lat]
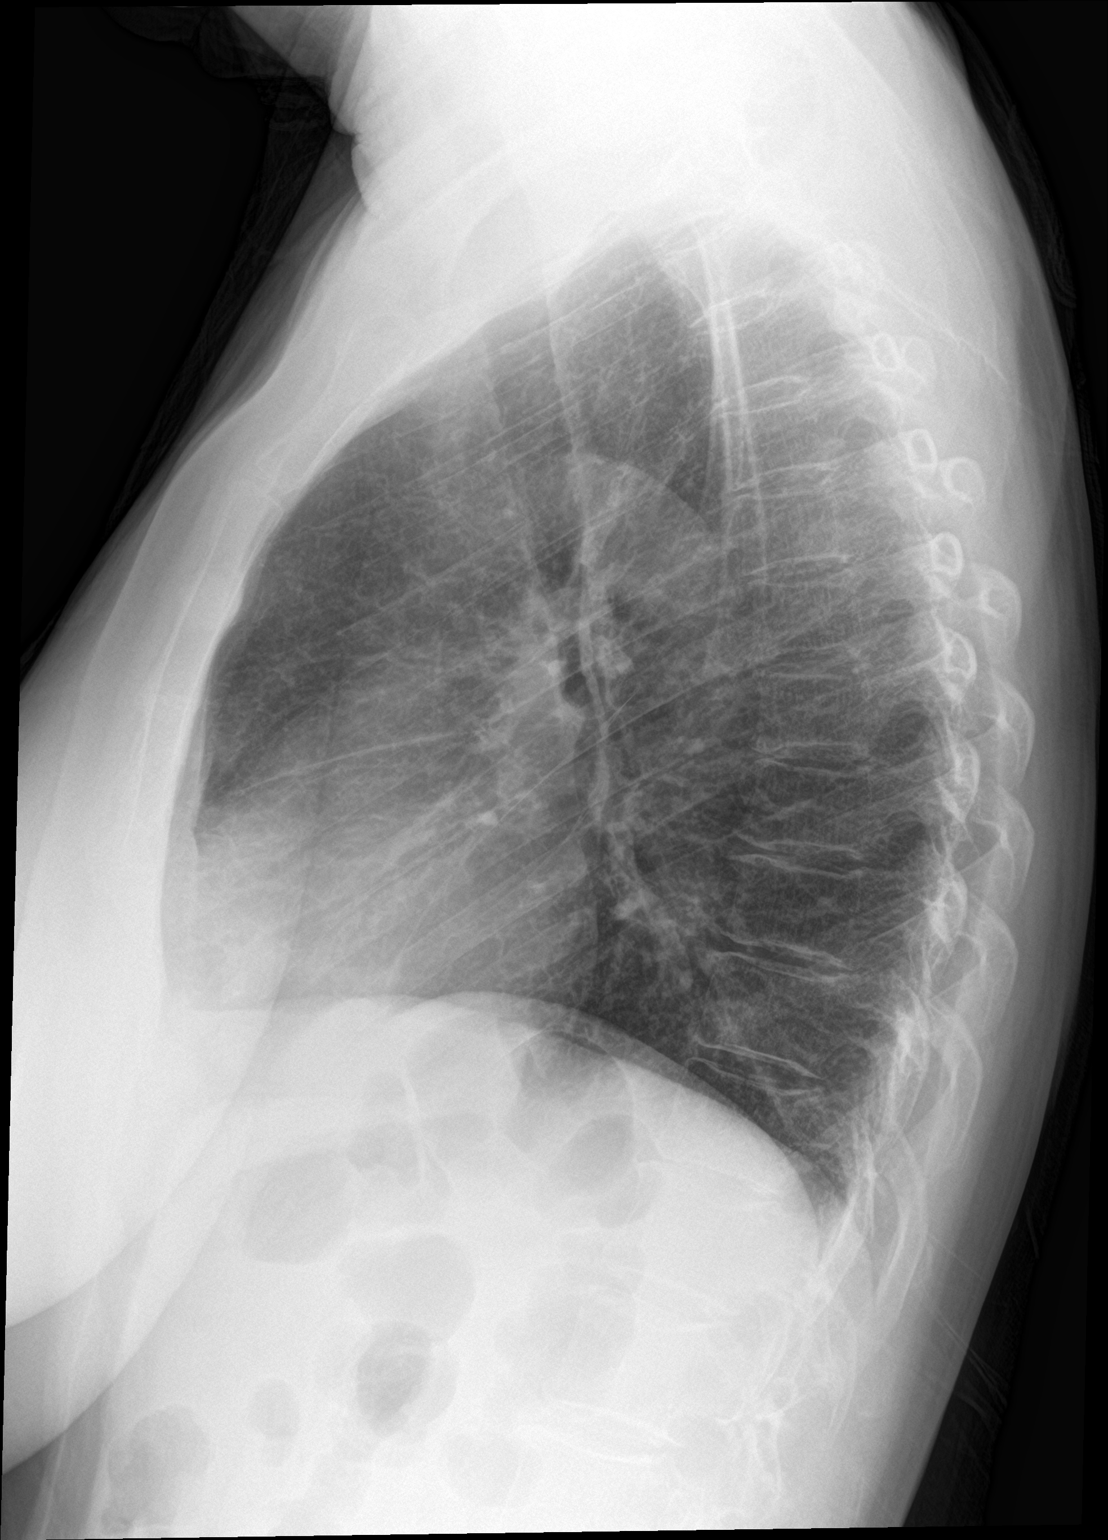

[2 of 2 positions shown; findings below may reference images not displayed]

FINDINGS: The cardiomediastinal silhouette is normal.

There is no focal consolidation or pulmonary edema. There is no
pleural effusion or pneumothorax.

There is no acute osseous abnormality.
IMPRESSION: No radiographic evidence of acute cardiopulmonary process.

## 2023-06-01 ENCOUNTER — Other Ambulatory Visit: Payer: Self-pay | Admitting: Physician Assistant

## 2023-06-01 DIAGNOSIS — J441 Chronic obstructive pulmonary disease with (acute) exacerbation: Secondary | ICD-10-CM

## 2023-06-15 ENCOUNTER — Telehealth: Payer: Self-pay | Admitting: *Deleted

## 2023-09-02 ENCOUNTER — Telehealth: Admitting: Family Medicine

## 2023-09-02 DIAGNOSIS — J418 Mixed simple and mucopurulent chronic bronchitis: Secondary | ICD-10-CM

## 2023-09-02 DIAGNOSIS — J440 Chronic obstructive pulmonary disease with acute lower respiratory infection: Secondary | ICD-10-CM

## 2023-09-02 DIAGNOSIS — J209 Acute bronchitis, unspecified: Secondary | ICD-10-CM | POA: Diagnosis not present

## 2023-09-02 DIAGNOSIS — J441 Chronic obstructive pulmonary disease with (acute) exacerbation: Secondary | ICD-10-CM

## 2023-09-02 MED ORDER — ALBUTEROL SULFATE (2.5 MG/3ML) 0.083% IN NEBU: 2.5000 mg | INHALATION_SOLUTION | RESPIRATORY_TRACT | 0 refills | Status: DC | PRN

## 2023-09-02 MED ORDER — ALBUTEROL SULFATE HFA 108 (90 BASE) MCG/ACT IN AERS: 2.0000 | INHALATION_SPRAY | Freq: Four times a day (QID) | RESPIRATORY_TRACT | 0 refills | Status: DC | PRN

## 2023-09-02 MED ORDER — BUDESONIDE-FORMOTEROL FUMARATE 160-4.5 MCG/ACT IN AERO
2.0000 | INHALATION_SPRAY | Freq: Two times a day (BID) | RESPIRATORY_TRACT | 3 refills | Status: DC
Start: 2023-09-02 — End: 2023-09-08

## 2023-09-02 NOTE — Progress Notes (Signed)
 Virtual Visit Consent   Phyllis Price, you are scheduled for a virtual visit with a [facility] provider today. Just as with appointments in the office, your consent must be obtained to participate. Your consent will be active for this visit and any virtual visit you may have with one of our providers in the next 365 days. If you have a MyChart account, a copy of this consent can be sent to you electronically.  As this is a virtual visit, video technology does not allow for your provider to perform a traditional examination. This may limit your provider's ability to fully assess your condition. If your provider identifies any concerns that need to be evaluated in person or the need to arrange testing (such as labs, EKG, etc.), we will make arrangements to do so. Although advances in technology are sophisticated, we cannot ensure that it will always work on either your end or our end. If the connection with a video visit is poor, the visit may have to be switched to a telephone visit. With either a video or telephone visit, we are not always able to ensure that we have a secure connection.  By engaging in this virtual visit, you consent to the provision of healthcare and authorize for your insurance to be billed (if applicable) for the services provided during this visit. Depending on your insurance coverage, you may receive a charge related to this service.  I need to obtain your verbal consent now. Are you willing to proceed with your visit today? Phyllis Price has provided verbal consent on 09/02/2023 for a virtual visit (video or telephone). Phyllis Huger, FNP  Date: 09/02/2023 12:41 PM   Virtual Visit via Video Note   I, Phyllis Price, connected with  Phyllis Price  ([id number], 1956/01/29) on 09/02/23 at 12:30 PM EDT by a video-enabled telemedicine application and verified that I am speaking with the correct person using two identifiers.  Location: Patient: Virtual Visit Location  Patient: Home Provider: Virtual Visit Location Provider: Home Office   I discussed the limitations of evaluation and management by telemedicine and the availability of in person appointments. The patient expressed understanding and agreed to proceed.    History of Present Illness: Phyllis Price is a 68 y.o. who identifies as a female who was assigned female at birth, and is being seen today for refills on inhalers and nebs for copd. Albuterol  inhaler and nebs and symbicort . She is in no distress. Phyllis Price  HPI: HPI  Problems:  Patient Active Problem List   Diagnosis Date Noted   Family hx of lung cancer 01/28/2012   Family history of colon cancer 01/28/2012   CHRONIC OBSTRUCTIVE ASTHMA WITH EXACERBATION 02/15/2008   TOBACCO USE 12/27/2007   HYPOTHYROIDISM 11/13/2006   ASTHMA 11/13/2006    Allergies:  Allergies  Allergen Reactions   Codeine     Medications:  Current Outpatient Medications:    albuterol  (VENTOLIN  HFA) 108 (90 Base) MCG/ACT inhaler, Inhale 2 puffs into the lungs every 6 (six) hours as needed for wheezing or shortness of breath., Disp: 8 g, Rfl: 0   budesonide -formoterol  (SYMBICORT ) 160-4.5 MCG/ACT inhaler, Inhale 2 puffs into the lungs 2 (two) times daily., Disp: 10.2 g, Rfl: 3   albuterol  (PROVENTIL ) (2.5 MG/3ML) 0.083% nebulizer solution, Take 3 mLs (2.5 mg total) by nebulization every 4 (four) hours as needed for wheezing., Disp: 300 mL, Rfl: 0   albuterol  (VENTOLIN  HFA) 108 (90 Base) MCG/ACT inhaler, Inhale 1-2 puffs into the lungs every 4 (four) hours as  needed for wheezing or shortness of breath., Disp: 18 g, Rfl: 0   budesonide -formoterol  (SYMBICORT ) 160-4.5 MCG/ACT inhaler, Inhale 2 puffs into the lungs in the morning and at bedtime., Disp: 10.2 g, Rfl: 0   cetirizine  (ZYRTEC  ALLERGY) 10 MG tablet, Take 1 tablet (10 mg total) by mouth daily., Disp: 30 tablet, Rfl: 11   levothyroxine  (SYNTHROID , LEVOTHROID) 100 MCG tablet, Take 1 tablet (100 mcg total) by mouth  daily., Disp: 90 tablet, Rfl: 0   Misc. Devices (PULSE OXIMETER DELUXE) MISC, Use when needed to check oxygen status, Disp: 1 each, Rfl: 0  Observations/Objective: Patient is well-developed, well-nourished in no acute distress.  Resting comfortably  at home.  Head is normocephalic, atraumatic.  No labored breathing.  Speech is clear and coherent with logical content.  Patient is alert and oriented at baseline.    Assessment and Plan: 1. Acute bronchitis with COPD (HCC) (Primary)  2. CHRONIC OBSTRUCTIVE ASTHMA WITH EXACERBATION  3. Mixed simple and mucopurulent chronic bronchitis (HCC) - albuterol  (PROVENTIL ) (2.5 MG/3ML) 0.083% nebulizer solution; Take 3 mLs (2.5 mg total) by nebulization every 4 (four) hours as needed for wheezing.  Dispense: 300 mL; Refill: 0  UC if sx worsen. Follow up with pcp for further refills.   Follow Up Instructions: I discussed the assessment and treatment plan with the patient. The patient was provided an opportunity to ask questions and all were answered. The patient agreed with the plan and demonstrated an understanding of the instructions.  A copy of instructions were sent to the patient via MyChart unless otherwise noted below.     The patient was advised to call back or seek an in-person evaluation if the symptoms worsen or if the condition fails to improve as anticipated.    Phyllis Fellows, FNP

## 2023-09-02 NOTE — Patient Instructions (Signed)

## 2023-09-08 ENCOUNTER — Telehealth: Admitting: Physician Assistant

## 2023-09-08 DIAGNOSIS — Z760 Encounter for issue of repeat prescription: Secondary | ICD-10-CM

## 2023-09-08 DIAGNOSIS — J418 Mixed simple and mucopurulent chronic bronchitis: Secondary | ICD-10-CM | POA: Diagnosis not present

## 2023-09-08 MED ORDER — ALBUTEROL SULFATE (2.5 MG/3ML) 0.083% IN NEBU: 2.5000 mg | INHALATION_SOLUTION | RESPIRATORY_TRACT | 0 refills | Status: DC | PRN

## 2023-09-08 MED ORDER — BUDESONIDE-FORMOTEROL FUMARATE 160-4.5 MCG/ACT IN AERO: 2.0000 | INHALATION_SPRAY | Freq: Two times a day (BID) | RESPIRATORY_TRACT | 3 refills | Status: AC

## 2023-09-08 NOTE — Progress Notes (Signed)
 Virtual Visit Consent   Phyllis Price, you are scheduled for a virtual visit with a [facility] provider today. Just as with appointments in the office, your consent must be obtained to participate. Your consent will be active for this visit and any virtual visit you may have with one of our providers in the next 365 days. If you have a MyChart account, a copy of this consent can be sent to you electronically.  As this is a virtual visit, video technology does not allow for your provider to perform a traditional examination. This may limit your provider's ability to fully assess your condition. If your provider identifies any concerns that need to be evaluated in person or the need to arrange testing (such as labs, EKG, etc.), we will make arrangements to do so. Although advances in technology are sophisticated, we cannot ensure that it will always work on either your end or our end. If the connection with a video visit is poor, the visit may have to be switched to a telephone visit. With either a video or telephone visit, we are not always able to ensure that we have a secure connection.  By engaging in this virtual visit, you consent to the provision of healthcare and authorize for your insurance to be billed (if applicable) for the services provided during this visit. Depending on your insurance coverage, you may receive a charge related to this service.  I need to obtain your verbal consent now. Are you willing to proceed with your visit today? Phyllis Price has provided verbal consent on 09/08/2023 for a virtual visit (video or telephone). Teena Shuck, [place]  Date: 09/08/2023 12:04 PM   Virtual Visit via Video Note   I, Teena Shuck, connected with  Phyllis Price  ([id number], [birth year]) on 09/08/23 at 12:00 PM EDT by a video-enabled telemedicine application and verified that I am speaking with the correct person using two identifiers.  Location: Patient: Virtual Visit Location  Patient: Home Provider: Virtual Visit Location Provider: Home Office   I discussed the limitations of evaluation and management by telemedicine and the availability of in person appointments. The patient expressed understanding and agreed to proceed.    History of Present Illness: Phyllis Price is a 68 y.o. who identifies as a female who was assigned female at birth, and is being seen today for medication refill.  HPI:   Problems:  Patient Active Problem List   Diagnosis Date Noted   Family hx of lung cancer 01/28/2012   Family history of colon cancer 01/28/2012   CHRONIC OBSTRUCTIVE ASTHMA WITH EXACERBATION 02/15/2008   TOBACCO USE 12/27/2007   HYPOTHYROIDISM 11/13/2006   ASTHMA 11/13/2006    Allergies:  Allergies  Allergen Reactions   Codeine     Medications:  Current Outpatient Medications:    albuterol  (PROVENTIL ) (2.5 MG/3ML) 0.083% nebulizer solution, Take 3 mLs (2.5 mg total) by nebulization every 4 (four) hours as needed for wheezing., Disp: 300 mL, Rfl: 0   budesonide -formoterol  (SYMBICORT ) 160-4.5 MCG/ACT inhaler, Inhale 2 puffs into the lungs 2 (two) times daily., Disp: 10.2 g, Rfl: 3   cetirizine  (ZYRTEC  ALLERGY) 10 MG tablet, Take 1 tablet (10 mg total) by mouth daily., Disp: 30 tablet, Rfl: 11   levothyroxine  (SYNTHROID , LEVOTHROID) 100 MCG tablet, Take 1 tablet (100 mcg total) by mouth daily., Disp: 90 tablet, Rfl: 0   Misc. Devices (PULSE OXIMETER DELUXE) MISC, Use when needed to check oxygen status, Disp: 1 each, Rfl: 0  Observations/Objective: Patient is well-developed, well-nourished in  no acute distress.  Resting comfortably  at home.  Head is normocephalic, atraumatic.  No labored breathing.  Speech is clear and coherent with logical content.  Patient is alert and oriented at baseline.    Assessment and Plan: 1. Mixed simple and mucopurulent chronic bronchitis (HCC) - albuterol  (PROVENTIL ) (2.5 MG/3ML) 0.083% nebulizer solution; Take 3 mLs (2.5 mg  total) by nebulization every 4 (four) hours as needed for wheezing.  Dispense: 300 mL; Refill: 0  2. Medication refill (Primary)  Patient presenting for medication refill. No fever chills or shortness of breath. [facility] said [facility] received her prescriptions. Advised to message via mychart should she have any issues today.  Follow Up Instructions: I discussed the assessment and treatment plan with the patient. The patient was provided an opportunity to ask questions and all were answered. The patient agreed with the plan and demonstrated an understanding of the instructions.  A copy of instructions were sent to the patient via MyChart unless otherwise noted below.    The patient was advised to call back or seek an in-person evaluation if the symptoms worsen or if the condition fails to improve as anticipated.    Teena Shuck, PA-C

## 2023-09-08 NOTE — Addendum Note (Signed)
 Addended by: ALMEDA DEGREE on: 09/08/2023 03:19 PM   Modules accepted: Level of Service

## 2023-09-08 NOTE — Patient Instructions (Signed)
  Phyllis Price, thank you for joining Teena Shuck, PA-C for today's virtual visit.  While this provider is not your primary care provider (PCP), if your PCP is located in our provider database this encounter information will be shared with them immediately following your visit.   A [facility] MyChart account gives you access to today's visit and all your visits, tests, and labs performed at [facility]  click here if you don't have a [facility] MyChart account or go to [email or web address]  Consent: (Patient) Phyllis Price provided verbal consent for this virtual visit at the beginning of the encounter.  Current Medications:  Current Outpatient Medications:    albuterol  (PROVENTIL ) (2.5 MG/3ML) 0.083% nebulizer solution, Take 3 mLs (2.5 mg total) by nebulization every 4 (four) hours as needed for wheezing., Disp: 300 mL, Rfl: 0   budesonide -formoterol  (SYMBICORT ) 160-4.5 MCG/ACT inhaler, Inhale 2 puffs into the lungs 2 (two) times daily., Disp: 10.2 g, Rfl: 3   cetirizine  (ZYRTEC  ALLERGY) 10 MG tablet, Take 1 tablet (10 mg total) by mouth daily., Disp: 30 tablet, Rfl: 11   levothyroxine  (SYNTHROID , LEVOTHROID) 100 MCG tablet, Take 1 tablet (100 mcg total) by mouth daily., Disp: 90 tablet, Rfl: 0   Misc. Devices (PULSE OXIMETER DELUXE) MISC, Use when needed to check oxygen status, Disp: 1 each, Rfl: 0   Medications ordered in this encounter:  Meds ordered this encounter  Medications   albuterol  (PROVENTIL ) (2.5 MG/3ML) 0.083% nebulizer solution    Sig: Take 3 mLs (2.5 mg total) by nebulization every 4 (four) hours as needed for wheezing.    Dispense:  300 mL    Refill:  0    Supervising Provider:   LAMPTEY, PHILIP O [[id number]]   budesonide -formoterol  (SYMBICORT ) 160-4.5 MCG/ACT inhaler    Sig: Inhale 2 puffs into the lungs 2 (two) times daily.    Dispense:  10.2 g    Refill:  3    Supervising Provider:   BLAISE ALEENE KIDD [[id number]]     *If you need refills  on other medications prior to your next appointment, please contact your pharmacy*  Follow-Up: Call back or seek an in-person evaluation if the symptoms worsen or if the condition fails to improve as anticipated.  [facility] [phone]  Other Instructions Please report to the nearest Emergency room with any worsening symptoms. Follow up with primary care provider (PCP) in 2 -3 days.    If you have been instructed to have an in-person evaluation today at a local Urgent Care facility, please use the link below. It will take you to a list of all of our available [facility], including address, phone number and hours of operation. Please do not delay care.  [facility]  If you or a family member do not have a primary care provider, use the link below to schedule a visit and establish care. When you choose a [facility] primary care physician or advanced practice provider, you gain a long-term partner in health. Find a Primary Care Provider  Learn more about [facility]'s in-office and virtual care options:  - Get Care Now

## 2023-12-08 ENCOUNTER — Telehealth

## 2023-12-08 ENCOUNTER — Telehealth: Admitting: Family Medicine

## 2023-12-08 DIAGNOSIS — J441 Chronic obstructive pulmonary disease with (acute) exacerbation: Secondary | ICD-10-CM | POA: Diagnosis not present

## 2023-12-08 DIAGNOSIS — J418 Mixed simple and mucopurulent chronic bronchitis: Secondary | ICD-10-CM

## 2023-12-08 MED ORDER — BUDESONIDE-FORMOTEROL FUMARATE 160-4.5 MCG/ACT IN AERO: 2.0000 | INHALATION_SPRAY | Freq: Two times a day (BID) | RESPIRATORY_TRACT | 0 refills | Status: AC

## 2023-12-08 MED ORDER — ALBUTEROL SULFATE (2.5 MG/3ML) 0.083% IN NEBU: 2.5000 mg | INHALATION_SOLUTION | RESPIRATORY_TRACT | 0 refills | Status: AC | PRN

## 2023-12-08 MED ORDER — ALBUTEROL SULFATE HFA 108 (90 BASE) MCG/ACT IN AERS: 2.0000 | INHALATION_SPRAY | Freq: Four times a day (QID) | RESPIRATORY_TRACT | 0 refills | Status: AC | PRN

## 2023-12-08 NOTE — Progress Notes (Signed)
 Virtual Visit Consent   Phyllis Price, you are scheduled for a virtual visit with a [facility] provider today. Just as with appointments in the office, your consent must be obtained to participate. Your consent will be active for this visit and any virtual visit you may have with one of our providers in the next 365 days. If you have a MyChart account, a copy of this consent can be sent to you electronically.  As this is a virtual visit, video technology does not allow for your provider to perform a traditional examination. This may limit your provider's ability to fully assess your condition. If your provider identifies any concerns that need to be evaluated in person or the need to arrange testing (such as labs, EKG, etc.), we will make arrangements to do so. Although advances in technology are sophisticated, we cannot ensure that it will always work on either your end or our end. If the connection with a video visit is poor, the visit may have to be switched to a telephone visit. With either a video or telephone visit, we are not always able to ensure that we have a secure connection.  By engaging in this virtual visit, you consent to the provision of healthcare and authorize for your insurance to be billed (if applicable) for the services provided during this visit. Depending on your insurance coverage, you may receive a charge related to this service.  I need to obtain your verbal consent now. Are you willing to proceed with your visit today? Phyllis Price has provided verbal consent on 12/08/2023 for a virtual visit (video or telephone). Phyllis Lamp, FNP  Date: 12/08/2023 11:19 AM   Virtual Visit via Video Note   I, Phyllis Price, connected with  Phyllis Price  ([id number], [birth year]) on 12/08/23 at 11:15 AM EDT by a video-enabled telemedicine application and verified that I am speaking with the correct person using two identifiers.  Location: Patient: Virtual Visit Location  Patient: Home Provider: Virtual Visit Location Provider: Home Office   I discussed the limitations of evaluation and management by telemedicine and the availability of in person appointments. The patient expressed understanding and agreed to proceed.    History of Present Illness: Phyllis Price is a 68 y.o. who identifies as a female who was assigned female at birth, and is being seen today for refill on symbicort , albuterol  for neb an albuterol  inhaler. She says with a change of weather her breathing has worsened with wheezing. No fever, No signs of infection. She is in no distress. [facility]  HPI: HPI  Problems:  Patient Active Problem List   Diagnosis Date Noted   Family hx of lung cancer 01/28/2012   Family history of colon cancer 01/28/2012   CHRONIC OBSTRUCTIVE ASTHMA WITH EXACERBATION 02/15/2008   TOBACCO USE 12/27/2007   HYPOTHYROIDISM 11/13/2006   ASTHMA 11/13/2006    Allergies:  Allergies  Allergen Reactions   Codeine     Medications:  Current Outpatient Medications:    albuterol  (VENTOLIN  HFA) 108 (90 Base) MCG/ACT inhaler, Inhale 2 puffs into the lungs every 6 (six) hours as needed for wheezing or shortness of breath., Disp: 8 g, Rfl: 0   budesonide -formoterol  (SYMBICORT ) 160-4.5 MCG/ACT inhaler, Inhale 2 puffs into the lungs 2 (two) times daily., Disp: 1 each, Rfl: 0   albuterol  (PROVENTIL ) (2.5 MG/3ML) 0.083% nebulizer solution, Take 3 mLs (2.5 mg total) by nebulization every 4 (four) hours as needed for wheezing., Disp: 300 mL, Rfl: 0   budesonide -formoterol  (SYMBICORT ) 160-4.5 MCG/ACT  inhaler, Inhale 2 puffs into the lungs 2 (two) times daily., Disp: 10.2 g, Rfl: 3   cetirizine  (ZYRTEC  ALLERGY) 10 MG tablet, Take 1 tablet (10 mg total) by mouth daily., Disp: 30 tablet, Rfl: 11   levothyroxine  (SYNTHROID , LEVOTHROID) 100 MCG tablet, Take 1 tablet (100 mcg total) by mouth daily., Disp: 90 tablet, Rfl: 0   Misc. Devices (PULSE OXIMETER DELUXE) MISC, Use when needed to check  oxygen status, Disp: 1 each, Rfl: 0  Observations/Objective: Patient is well-developed, well-nourished in no acute distress.  Resting comfortably  at home.  Head is normocephalic, atraumatic.  No labored breathing.  Speech is clear and coherent with logical content.  Patient is alert and oriented at baseline.    Assessment and Plan: 1. Mixed simple and mucopurulent chronic bronchitis (HCC) - albuterol  (PROVENTIL ) (2.5 MG/3ML) 0.083% nebulizer solution; Take 3 mLs (2.5 mg total) by nebulization every 4 (four) hours as needed for wheezing.  Dispense: 300 mL; Refill: 0  Follow up with pcp for refills. UC as needed.   Follow Up Instructions: I discussed the assessment and treatment plan with the patient. The patient was provided an opportunity to ask questions and all were answered. The patient agreed with the plan and demonstrated an understanding of the instructions.  A copy of instructions were sent to the patient via MyChart unless otherwise noted below.     The patient was advised to call back or seek an in-person evaluation if the symptoms worsen or if the condition fails to improve as anticipated.    Phyllis Fluegel, FNP

## 2023-12-08 NOTE — Patient Instructions (Signed)
 Asthma, Adult  Asthma is a long-term (chronic) condition that causes recurrent episodes in which the lower airways in the lungs become tight and narrow. The narrowing is caused by inflammation and tightening of the smooth muscle around the lower airways. Asthma episodes, also called asthma attacks or asthma flares, may cause coughing, making high-pitched whistling sounds when you breathe, most often when you breathe out (wheezing), shortness of breath, and chest pain. The airways may produce extra mucus caused by the inflammation and irritation. During an attack, it can be difficult to breathe. Asthma attacks can range from minor to life-threatening. Asthma cannot be cured, but medicines and lifestyle changes can help control it and treat acute attacks. It is important to keep your asthma well controlled so the condition does not interfere with your daily life. What are the causes? This condition is believed to be caused by inherited (genetic) and environmental factors, but its exact cause is not known. What can trigger an asthma attack? Many things can bring on an asthma attack or make symptoms worse. These triggers are different for every person. Common triggers include: Allergens and irritants like mold, dust, pet dander, cockroaches, pollen, air pollution, and chemical odors. Cigarette smoke. Weather changes and cold air. Stress and strong emotional responses such as crying or laughing hard. Certain medications such as aspirin or beta blockers. Infections and inflammatory conditions, such as the flu, a cold, pneumonia, or inflammation of the nasal membranes (rhinitis). Gastroesophageal reflux disease (GERD). What are the signs or symptoms? Symptoms may occur right after exposure to an asthma trigger or hours later and can vary by person. Common signs and symptoms include: Wheezing. Trouble breathing (shortness of breath). Excessive nighttime or early morning coughing. Chest  tightness. Tiredness (fatigue) with minimal activity. Difficulty talking in complete sentences. Poor exercise tolerance. How is this diagnosed? This condition is diagnosed based on: A physical exam and your medical history. Tests, which may include: Lung function studies to evaluate the flow of air in your lungs. Allergy tests. Imaging tests, such as X-rays. How is this treated? There is no cure, but symptoms can be controlled with proper treatment. Treatment usually involves: Identifying and avoiding your asthma triggers. Inhaled medicines. Two types are commonly used to treat asthma, depending on severity: Controller medicines. These help prevent asthma symptoms from occurring. They are taken every day. Fast-acting reliever or rescue medicines. These quickly relieve asthma symptoms. They are used as needed and provide short-term relief. Using other medicines, such as: Allergy medicines, such as antihistamines, if your asthma attacks are triggered by allergens. Immune medicines (immunomodulators). These are medicines that help control the immune system. Using supplemental oxygen. This is only needed during a severe episode. Creating an asthma action plan. An asthma action plan is a written plan for managing and treating your asthma attacks. This plan includes: A list of your asthma triggers and how to avoid them. Information about when medicines should be taken and when their dosage should be changed. Instructions about using a device called a peak flow meter. A peak flow meter measures how well the lungs are working and the severity of your asthma. It helps you monitor your condition. Follow these instructions at home: Take over-the-counter and prescription medicines only as told by your health care provider. Stay up to date on all vaccinations as recommended by your healthcare provider, including vaccines for the flu and pneumonia. Use a peak flow meter and keep track of your peak flow  readings. Understand and use your asthma  action plan to address any asthma flares. Do not smoke or allow anyone to smoke in your home. Contact a health care provider if: You have wheezing, shortness of breath, or a cough that is not responding to medicines. Your medicines are causing side effects, such as a rash, itching, swelling, or trouble breathing. You need to use a reliever medicine more than 2-3 times a week. Your peak flow reading is still at 50-79% of your personal best after following your action plan for 1 hour. You have a fever and shortness of breath. Get help right away if: You are getting worse and do not respond to treatment during an asthma attack. You are short of breath when at rest or when doing very little physical activity. You have difficulty eating, drinking, or talking. You have chest pain or tightness. You develop a fast heartbeat or palpitations. You have a bluish color to your lips or fingernails. You are light-headed or dizzy, or you faint. Your peak flow reading is less than 50% of your personal best. You feel too tired to breathe normally. These symptoms may be an emergency. Get help right away. Call 911. Do not wait to see if the symptoms will go away. Do not drive yourself to the hospital. Summary Asthma is a long-term (chronic) condition that causes recurrent episodes in which the airways become tight and narrow. Asthma episodes, also called asthma attacks or asthma flares, can cause coughing, wheezing, shortness of breath, and chest pain. Asthma cannot be cured, but medicines and lifestyle changes can help keep it well controlled and prevent asthma flares. Make sure you understand how to avoid triggers and how and when to use your medicines. Asthma attacks can range from minor to life-threatening. Get help right away if you have an asthma attack and do not respond to treatment with your usual rescue medicines. This information is not intended to replace  advice given to you by your health care provider. Make sure you discuss any questions you have with your health care provider. Document Revised: 12/22/2020 Document Reviewed: 12/13/2020 Elsevier Patient Education  2024 [employer].

## 2023-12-14 ENCOUNTER — Telehealth: Admitting: Family Medicine

## 2023-12-14 ENCOUNTER — Telehealth: Admitting: Physician Assistant

## 2023-12-14 DIAGNOSIS — B9689 Other specified bacterial agents as the cause of diseases classified elsewhere: Secondary | ICD-10-CM

## 2023-12-14 DIAGNOSIS — J441 Chronic obstructive pulmonary disease with (acute) exacerbation: Secondary | ICD-10-CM

## 2023-12-14 DIAGNOSIS — J019 Acute sinusitis, unspecified: Secondary | ICD-10-CM | POA: Diagnosis not present

## 2023-12-14 MED ORDER — PREDNISONE 20 MG PO TABS
20.0000 mg | ORAL_TABLET | Freq: Two times a day (BID) | ORAL | 0 refills | Status: AC
Start: 2023-12-14 — End: 2023-12-19

## 2023-12-14 MED ORDER — AMOXICILLIN-POT CLAVULANATE 875-125 MG PO TABS
1.0000 | ORAL_TABLET | Freq: Two times a day (BID) | ORAL | 0 refills | Status: AC
Start: 2023-12-14 — End: ?

## 2023-12-14 NOTE — Patient Instructions (Signed)

## 2023-12-14 NOTE — Progress Notes (Signed)
 Virtual Visit Consent   Phyllis Price, you are scheduled for a virtual visit with a [facility] provider today. Just as with appointments in the office, your consent must be obtained to participate. Your consent will be active for this visit and any virtual visit you may have with one of our providers in the next 365 days. If you have a MyChart account, a copy of this consent can be sent to you electronically.  As this is a virtual visit, video technology does not allow for your provider to perform a traditional examination. This may limit your provider's ability to fully assess your condition. If your provider identifies any concerns that need to be evaluated in person or the need to arrange testing (such as labs, EKG, etc.), we will make arrangements to do so. Although advances in technology are sophisticated, we cannot ensure that it will always work on either your end or our end. If the connection with a video visit is poor, the visit may have to be switched to a telephone visit. With either a video or telephone visit, we are not always able to ensure that we have a secure connection.  By engaging in this virtual visit, you consent to the provision of healthcare and authorize for your insurance to be billed (if applicable) for the services provided during this visit. Depending on your insurance coverage, you may receive a charge related to this service.  I need to obtain your verbal consent now. Are you willing to proceed with your visit today? [name] has provided verbal consent on 12/14/2023 for a virtual visit (video or telephone). [name], FNP  Date: 12/14/2023 2:23 PM   Virtual Visit via Video Note   I, [name], connected with  Phyllis Price  ([id number], Sep 10, 1955) on 12/14/23 at  2:15 PM EDT by a video-enabled telemedicine application and verified that I am speaking with the correct person using two identifiers.  Location: Patient: Virtual Visit Location Patient:  Home Provider: Virtual Visit Location Provider: Home Office   I discussed the limitations of evaluation and management by telemedicine and the availability of in person appointments. The patient expressed understanding and agreed to proceed.    History of Present Illness: Phyllis Price is a 68 y.o. who identifies as a female who was assigned female at birth, and is being seen today for sinus pressure and pain with pain over left maxillary sinus and mild swelling. No fever. Sx for 1.5 weeks worsening with swelling this am. .  HPI: HPI  Problems:  Patient Active Problem List   Diagnosis Date Noted   Family hx of lung cancer 01/28/2012   Family history of colon cancer 01/28/2012   CHRONIC OBSTRUCTIVE ASTHMA WITH EXACERBATION 02/15/2008   TOBACCO USE 12/27/2007   HYPOTHYROIDISM 11/13/2006   ASTHMA 11/13/2006    Allergies:  Allergies  Allergen Reactions   Codeine     Medications:  Current Outpatient Medications:    amoxicillin -clavulanate (AUGMENTIN ) 875-125 MG tablet, Take 1 tablet by mouth 2 (two) times daily., Disp: 20 tablet, Rfl: 0   predniSONE  (DELTASONE ) 20 MG tablet, Take 1 tablet (20 mg total) by mouth 2 (two) times daily with a meal for 5 days., Disp: 10 tablet, Rfl: 0   albuterol  (PROVENTIL ) (2.5 MG/3ML) 0.083% nebulizer solution, Take 3 mLs (2.5 mg total) by nebulization every 4 (four) hours as needed for wheezing., Disp: 300 mL, Rfl: 0   albuterol  (VENTOLIN  HFA) 108 (90 Base) MCG/ACT inhaler, Inhale 2 puffs into the lungs every 6 (  six) hours as needed for wheezing or shortness of breath., Disp: 8 g, Rfl: 0   budesonide -formoterol  (SYMBICORT ) 160-4.5 MCG/ACT inhaler, Inhale 2 puffs into the lungs 2 (two) times daily., Disp: 10.2 g, Rfl: 3   budesonide -formoterol  (SYMBICORT ) 160-4.5 MCG/ACT inhaler, Inhale 2 puffs into the lungs 2 (two) times daily., Disp: 1 each, Rfl: 0   cetirizine  (ZYRTEC  ALLERGY) 10 MG tablet, Take 1 tablet (10 mg total) by mouth daily., Disp: 30 tablet,  Rfl: 11   levothyroxine  (SYNTHROID , LEVOTHROID) 100 MCG tablet, Take 1 tablet (100 mcg total) by mouth daily., Disp: 90 tablet, Rfl: 0   Misc. Devices (PULSE OXIMETER DELUXE) MISC, Use when needed to check oxygen status, Disp: 1 each, Rfl: 0  Observations/Objective: Patient is well-developed, well-nourished in no acute distress.  Resting comfortably  at home.  Head is normocephalic, atraumatic.  No labored breathing.  Speech is clear and coherent with logical content.  Patient is alert and oriented at baseline.    Assessment and Plan: 1. Acute bacterial sinusitis (Primary)  Increase fluids, humidifier at night, tylenol or ibuprofen as directed, UC as needed.   Follow Up Instructions: I discussed the assessment and treatment plan with the patient. The patient was provided an opportunity to ask questions and all were answered. The patient agreed with the plan and demonstrated an understanding of the instructions.  A copy of instructions were sent to the patient via MyChart unless otherwise noted below.    The patient was advised to call back or seek an in-person evaluation if the symptoms worsen or if the condition fails to improve as anticipated.    Tranell Wojtkiewicz, FNP

## 2023-12-14 NOTE — Progress Notes (Signed)
  Because you are having worsening symptoms despite recent evaluation and treatment on 12/08/23, I feel your condition warrants further evaluation and I recommend that you be seen in a face-to-face visit.   NOTE: There will be NO CHARGE for this E-Visit   If you are having a true medical emergency, please call 911.     For an urgent face to face visit, [facility] has multiple urgent care centers for your convenience.  Click the link below for the full list of locations and hours, walk-in wait times, appointment scheduling options and driving directions:  Urgent Care - [place], [place], [place], [place], [place], [place]  [place]     Your MyChart E-visit questionnaire answers were reviewed by a board certified advanced clinical practitioner to complete your personal care plan based on your specific symptoms.    Thank you for using e-Visits.      I have spent 5 minutes in review of e-visit questionnaire, review and updating patient chart, medical decision making and response to patient.   Delon CHRISTELLA Dickinson, PA-C
